# Patient Record
Sex: Male | Born: 1970 | Race: White | Hispanic: No | Marital: Married | Smoking: Former smoker
Health system: Southern US, Community
[De-identification: ages and names within clinical notes are randomized; demographics above are authoritative.]

## PROBLEM LIST (undated history)

## (undated) DIAGNOSIS — Z789 Other specified health status: Secondary | ICD-10-CM

## (undated) HISTORY — PX: NO PAST SURGERIES: SHX2092

---

## 2004-01-24 ENCOUNTER — Emergency Department: Payer: Self-pay | Admitting: Emergency Medicine

## 2004-02-21 ENCOUNTER — Emergency Department: Payer: Self-pay | Admitting: Internal Medicine

## 2004-04-20 ENCOUNTER — Emergency Department: Payer: Self-pay | Admitting: Emergency Medicine

## 2004-05-13 ENCOUNTER — Emergency Department: Payer: Self-pay | Admitting: Emergency Medicine

## 2004-07-18 ENCOUNTER — Emergency Department: Payer: Self-pay | Admitting: Emergency Medicine

## 2004-08-07 ENCOUNTER — Emergency Department: Payer: Self-pay | Admitting: Emergency Medicine

## 2004-09-12 ENCOUNTER — Emergency Department: Payer: Self-pay | Admitting: Emergency Medicine

## 2004-12-02 ENCOUNTER — Emergency Department: Payer: Self-pay | Admitting: Emergency Medicine

## 2005-01-08 ENCOUNTER — Emergency Department: Payer: Self-pay | Admitting: Emergency Medicine

## 2005-04-27 ENCOUNTER — Emergency Department: Payer: Self-pay | Admitting: Emergency Medicine

## 2005-05-22 ENCOUNTER — Emergency Department: Payer: Self-pay | Admitting: Emergency Medicine

## 2005-05-24 ENCOUNTER — Inpatient Hospital Stay: Payer: Self-pay | Admitting: Otolaryngology

## 2005-07-31 ENCOUNTER — Emergency Department: Payer: Self-pay | Admitting: Emergency Medicine

## 2005-10-18 ENCOUNTER — Emergency Department: Payer: Self-pay | Admitting: Emergency Medicine

## 2006-04-10 ENCOUNTER — Emergency Department: Payer: Self-pay | Admitting: Emergency Medicine

## 2006-05-12 ENCOUNTER — Emergency Department: Payer: Self-pay | Admitting: Emergency Medicine

## 2006-09-10 ENCOUNTER — Emergency Department: Payer: Self-pay | Admitting: Emergency Medicine

## 2008-02-23 ENCOUNTER — Emergency Department: Payer: Self-pay | Admitting: Emergency Medicine

## 2008-02-26 ENCOUNTER — Emergency Department: Payer: Self-pay | Admitting: Emergency Medicine

## 2008-09-07 ENCOUNTER — Emergency Department: Payer: Self-pay | Admitting: Emergency Medicine

## 2009-05-04 ENCOUNTER — Emergency Department: Payer: Self-pay | Admitting: Emergency Medicine

## 2009-11-21 ENCOUNTER — Emergency Department: Payer: Self-pay | Admitting: Emergency Medicine

## 2010-11-05 ENCOUNTER — Emergency Department: Payer: Self-pay | Admitting: Emergency Medicine

## 2011-04-30 ENCOUNTER — Emergency Department: Payer: Self-pay | Admitting: Emergency Medicine

## 2011-05-02 LAB — BETA STREP CULTURE(ARMC)

## 2011-05-06 ENCOUNTER — Emergency Department: Payer: Self-pay | Admitting: Emergency Medicine

## 2011-05-07 ENCOUNTER — Emergency Department: Payer: Self-pay | Admitting: Emergency Medicine

## 2011-10-01 ENCOUNTER — Emergency Department: Payer: Self-pay | Admitting: Emergency Medicine

## 2012-02-29 ENCOUNTER — Emergency Department: Payer: Self-pay | Admitting: Emergency Medicine

## 2012-03-25 ENCOUNTER — Emergency Department: Payer: Self-pay | Admitting: Emergency Medicine

## 2012-03-25 LAB — RAPID INFLUENZA A&B ANTIGENS

## 2012-07-24 ENCOUNTER — Emergency Department: Payer: Self-pay | Admitting: Emergency Medicine

## 2012-12-19 ENCOUNTER — Emergency Department: Payer: Self-pay | Admitting: Internal Medicine

## 2012-12-19 LAB — COMPREHENSIVE METABOLIC PANEL
Albumin: 3.8 g/dL (ref 3.4–5.0)
Alkaline Phosphatase: 69 U/L (ref 50–136)
Anion Gap: 5 — ABNORMAL LOW (ref 7–16)
BUN: 14 mg/dL (ref 7–18)
Bilirubin,Total: 0.6 mg/dL (ref 0.2–1.0)
Calcium, Total: 9.1 mg/dL (ref 8.5–10.1)
Chloride: 107 mmol/L (ref 98–107)
Co2: 27 mmol/L (ref 21–32)
Creatinine: 1.21 mg/dL (ref 0.60–1.30)
EGFR (African American): >60
EGFR (Non-African Amer.): >60
Glucose: 81 mg/dL (ref 65–99)
Osmolality: 277 (ref 275–301)
Potassium: 3.7 mmol/L (ref 3.5–5.1)
SGOT(AST): 48 U/L — ABNORMAL HIGH (ref 15–37)
SGPT (ALT): 55 U/L (ref 12–78)
Sodium: 139 mmol/L (ref 136–145)
Total Protein: 7.4 g/dL (ref 6.4–8.2)

## 2012-12-19 LAB — URINALYSIS, COMPLETE
Bacteria: NONE SEEN
Bilirubin,UR: NEGATIVE
Glucose,UR: NEGATIVE mg/dL (ref 0–75)
Ketone: NEGATIVE
Leukocyte Esterase: NEGATIVE
Nitrite: NEGATIVE
Ph: 5 (ref 4.5–8.0)
Protein: NEGATIVE
RBC,UR: 1 /HPF (ref 0–5)
Specific Gravity: 1.026 (ref 1.003–1.030)
Squamous Epithelial: NONE SEEN
WBC UR: <1 /HPF (ref 0–5)

## 2012-12-19 LAB — CBC
HCT: 43.8 % (ref 40.0–52.0)
HGB: 15.1 g/dL (ref 13.0–18.0)
MCH: 29.5 pg (ref 26.0–34.0)
MCHC: 34.5 g/dL (ref 32.0–36.0)
MCV: 85 fL (ref 80–100)
Platelet: 201 10*3/uL (ref 150–440)
RBC: 5.13 10*6/uL (ref 4.40–5.90)
RDW: 13.6 % (ref 11.5–14.5)
WBC: 9.9 10*3/uL (ref 3.8–10.6)

## 2012-12-19 LAB — TROPONIN I: Troponin-I: <0.02 ng/mL

## 2013-03-09 ENCOUNTER — Emergency Department: Payer: Self-pay | Admitting: Emergency Medicine

## 2013-03-09 LAB — RAPID INFLUENZA A&B ANTIGENS

## 2013-06-21 ENCOUNTER — Emergency Department: Payer: Self-pay | Admitting: Emergency Medicine

## 2013-07-25 ENCOUNTER — Emergency Department: Payer: Self-pay | Admitting: Emergency Medicine

## 2014-11-28 ENCOUNTER — Emergency Department
Admission: EM | Admit: 2014-11-28 | Discharge: 2014-11-28 | Disposition: A | Discharge status: Home / Self Care | Payer: Self-pay | Attending: Emergency Medicine | Admitting: Emergency Medicine

## 2014-11-28 DIAGNOSIS — S86912A Strain of unspecified muscle(s) and tendon(s) at lower leg level, left leg, initial encounter: Secondary | ICD-10-CM | POA: Insufficient documentation

## 2014-11-28 DIAGNOSIS — Y998 Other external cause status: Secondary | ICD-10-CM | POA: Insufficient documentation

## 2014-11-28 DIAGNOSIS — Y9389 Activity, other specified: Secondary | ICD-10-CM | POA: Insufficient documentation

## 2014-11-28 DIAGNOSIS — X58XXXA Exposure to other specified factors, initial encounter: Secondary | ICD-10-CM | POA: Insufficient documentation

## 2014-11-28 DIAGNOSIS — Y9289 Other specified places as the place of occurrence of the external cause: Secondary | ICD-10-CM | POA: Insufficient documentation

## 2014-11-28 MED ORDER — TRAMADOL HCL 50 MG PO TABS: 50.0000 mg | ORAL_TABLET | Freq: Two times a day (BID) | ORAL | Status: DC

## 2014-11-28 MED ORDER — NABUMETONE 750 MG PO TABS: 750.0000 mg | ORAL_TABLET | Freq: Two times a day (BID) | ORAL | Status: DC

## 2014-11-28 NOTE — ED Notes (Signed)
Pt states that he injured his knee walking down a hill a week ago, pt states that the pain has cont and 2 nights ago he twisted again, pt c/o severe pain

## 2014-11-28 NOTE — Discharge Instructions (Signed)
Knee Sprain A knee sprain is a tear in one of the strong, fibrous tissues that connect the bones (ligaments) in your knee. The severity of the sprain depends on how much of the ligament is torn. The tear can be either partial or complete. CAUSES  Often, sprains are a result of a fall or injury. The force of the impact causes the fibers of your ligament to stretch too much. This excess tension causes the fibers of your ligament to tear. SIGNS AND SYMPTOMS  You may have some loss of motion in your knee. Other symptoms include:  Bruising.  Pain in the knee area.  Tenderness of the knee to the touch.  Swelling. DIAGNOSIS  To diagnose a knee sprain, your health care provider will physically examine your knee. Your health care provider may also suggest an X-ray exam of your knee to make sure no bones are broken. TREATMENT  If your ligament is only partially torn, treatment usually involves keeping the knee in a fixed position (immobilization) or bracing your knee for activities that require movement for several weeks. To do this, your health care provider will apply a bandage, cast, or splint to keep your knee from moving and to support your knee during movement until it heals. For a partially torn ligament, the healing process usually takes 4-6 weeks. If your ligament is completely torn, depending on which ligament it is, you may need surgery to reconnect the ligament to the bone or reconstruct it. After surgery, a cast or splint may be applied and will need to stay on your knee for 4-6 weeks while your ligament heals. HOME CARE INSTRUCTIONS  Keep your injured knee elevated to decrease swelling.  To ease pain and swelling, apply ice to the injured area:  Put ice in a plastic bag.  Place a towel between your skin and the bag.  Leave the ice on for 20 minutes, 2-3 times a day.  Only take medicine for pain as directed by your health care provider.  Do not leave your knee unprotected until  pain and stiffness go away (usually 4-6 weeks).  If you have a cast or splint, do not allow it to get wet. If you have been instructed not to remove it, cover it with a plastic bag when you shower or bathe. Do not swim.  Your health care provider may suggest exercises for you to do during your recovery to prevent or limit permanent weakness and stiffness. SEEK IMMEDIATE MEDICAL CARE IF:  Your cast or splint becomes damaged.  Your pain becomes worse.  You have significant pain, swelling, or numbness below the cast or splint. MAKE SURE YOU:  Understand these instructions.  Will watch your condition.  Will get help right away if you are not doing well or get worse. Document Released: 02/26/2005 Document Revised: 12/17/2012 Document Reviewed: 10/08/2012 [facility] Patient Information 2015 [place], [place]. This information is not intended to replace advice given to you by your health care provider. Make sure you discuss any questions you have with your health care provider.   Take the prescription meds as directed. Follow-up with Dr. Jimmey Ralph as needed.

## 2014-11-29 NOTE — ED Provider Notes (Signed)
[facility] Emergency Department Provider Note ____________________________________________  Time seen: 2035  I have reviewed the triage vital signs and the nursing notes.  HISTORY  Chief Complaint  Knee Pain  HPI Mike Herrera is a 44 y.o. male reports to the ED for evaluation of left knee pain, that was originally injured it better than a week ago when he [place]. He treated the strain with ice and anti-inflammatories at that time. 2 nights ago he reinjured it again as he twisted the knee while bending it. He is here today for evaluation of his left knee pain.He rates his pain at an 8/10 in triage. He denies prior history of ongoing knee problems.  No past medical history on file.  There are no active problems to display for this patient.  No past surgical history on file.  Current Outpatient Rx  Name  Route  Sig  Dispense  Refill  . nabumetone (RELAFEN) 750 MG tablet   Oral   Take 1 tablet (750 mg total) by mouth 2 (two) times daily.   30 tablet   0   . traMADol (ULTRAM) 50 MG tablet   Oral   Take 1 tablet (50 mg total) by mouth 2 (two) times daily.   10 tablet   0    Allergies Review of patient's allergies indicates no known allergies.  No family history on file.  Social History Social History  Substance Use Topics  . Smoking status: Not on file  . Smokeless tobacco: Not on file  . Alcohol Use: Not on file   Review of Systems  Constitutional: Negative for fever. Eyes: Negative for visual changes. ENT: Negative for sore throat. Cardiovascular: Negative for chest pain. Respiratory: Negative for shortness of breath. Gastrointestinal: Negative for abdominal pain, vomiting and diarrhea. Genitourinary: Negative for dysuria. Musculoskeletal: Negative for back pain. Left knee pain as above Skin: Negative for rash. Neurological: Negative for headaches, focal weakness or  numbness. ____________________________________________  PHYSICAL EXAM:  VITAL SIGNS: ED Triage Vitals  Enc Vitals Group     BP 11/28/14 1916 114/73 mmHg     Pulse Rate 11/28/14 1916 103     Resp 11/28/14 1916 20     Temp 11/28/14 1916 97.7 F (36.5 C)     Temp Source 11/28/14 1916 Oral     SpO2 11/28/14 1916 99 %     Weight 11/28/14 1916 240 lb (108.863 kg)     Height 11/28/14 1916  (1.93 m)     Head Cir --      Peak Flow --      Pain Score 11/28/14 1917 8     Pain Loc --      Pain Edu? --      Excl. in GC? --    Constitutional: Alert and oriented. Well appearing and in no distress. Eyes: Conjunctivae are normal. PERRL. Normal extraocular movements. ENT   Head: Normocephalic and atraumatic.   Nose: No congestion/rhinorrhea.   Mouth/Throat: Mucous membranes are moist.   Neck: Supple. No thyromegaly. Hematological/Lymphatic/Immunological: No cervical lymphadenopathy. Cardiovascular: Normal rate, regular rhythm.  Respiratory: Normal respiratory effort. No wheezes/rales/rhonchi. Gastrointestinal: Soft and nontender. No distention. Musculoskeletal: Left knee without obvious deformity, dislocation, or abrasion. Normal patellar tracking without laxity. Small joint effusion noted. Minor intermittent clicking and lateral patella with full flexion. No valgus or varus joint stress is appreciated. Negative anterior posterior drawer. No popliteal space fullness is noted. Negative Lachman and McMurray's on exam.  Nontender with normal range of motion in all extremities.  Neurologic:  Normal gait without ataxia. Normal speech and language. No gross focal neurologic deficits are appreciated. Skin:  Skin is warm, dry and intact. No rash noted. Psychiatric: Mood and affect are normal. Patient exhibits appropriate insight and judgment. ____________________________________________   RADIOLOGY deferred ____________________________________________  PROCEDURES  Knee  immobilizer applied ____________________________________________  INITIAL IMPRESSION / ASSESSMENT AND PLAN / ED COURSE  Left knee strain without indication for derangement. Suggest treatment with knee immobilizer, anti-inflammatories, and pain medicines as needed. Patient is encouraged to ice and rest the knee, and follow-up with orthopedics for symptoms persisting beyond a week. ____________________________________________  FINAL CLINICAL IMPRESSION(S) / ED DIAGNOSES  Final diagnoses:  Knee strain, left, initial encounter     Lissa Hoard, PA-C 11/29/14 0008  Emily Filbert, MD 12/01/14 [phone]

## 2015-02-14 ENCOUNTER — Emergency Department
Admission: EM | Admit: 2015-02-14 | Discharge: 2015-02-14 | Disposition: A | Discharge status: Home / Self Care | Payer: Self-pay | Attending: Emergency Medicine | Admitting: Emergency Medicine

## 2015-02-14 ENCOUNTER — Emergency Department: Payer: Self-pay

## 2015-02-14 ENCOUNTER — Encounter: Payer: Self-pay | Admitting: Emergency Medicine

## 2015-02-14 DIAGNOSIS — Z87891 Personal history of nicotine dependence: Secondary | ICD-10-CM | POA: Insufficient documentation

## 2015-02-14 DIAGNOSIS — M2392 Unspecified internal derangement of left knee: Secondary | ICD-10-CM | POA: Insufficient documentation

## 2015-02-14 DIAGNOSIS — Z791 Long term (current) use of non-steroidal anti-inflammatories (NSAID): Secondary | ICD-10-CM | POA: Insufficient documentation

## 2015-02-14 MED ORDER — NAPROXEN 500 MG PO TABS
500.0000 mg | ORAL_TABLET | Freq: Once | ORAL | Status: AC
  Administered 2015-02-14: 500 mg via ORAL
  Filled 2015-02-14: qty 1

## 2015-02-14 MED ORDER — TRAMADOL HCL 50 MG PO TABS
50.0000 mg | ORAL_TABLET | Freq: Once | ORAL | Status: AC
  Administered 2015-02-14: 50 mg via ORAL
  Filled 2015-02-14: qty 1

## 2015-02-14 MED ORDER — NAPROXEN SODIUM 275 MG PO TABS: 275.0000 mg | ORAL_TABLET | Freq: Two times a day (BID) | ORAL | Status: DC

## 2015-02-14 MED ORDER — TRAMADOL HCL 50 MG PO TABS: 50.0000 mg | ORAL_TABLET | Freq: Four times a day (QID) | ORAL | Status: AC | PRN

## 2015-02-14 NOTE — ED Notes (Signed)
Pt in w/ complaints of left knee pain, states hearing popping noise when walking.  Pt reports incident about a month ago injuring knee; was seen in ED and given brace to wear but states knee has not improved.

## 2015-02-14 NOTE — ED Provider Notes (Signed)
[facility] Emergency Department Provider Note  ____________________________________________  Time seen: Approximately 5:20 PM  I have reviewed the triage vital signs and the nursing notes.   HISTORY  Chief Complaint Knee Pain    HPI Mike Herrera is a 44 y.o. male patient complaining of continued left knee pain. Patient stated a month ago while walking down an embankment he felt a popping sensation and since then he is having continual pain. Patient stated he was seen in this ER 1 month ago and was given a knee brace but has not helped his knee. When questioned patient admits that he was given discharge instructions to follow orthopedics lost the paperwork. No other palliative measures taken for this complaint. Patient rated the pain as a 5/10. Patient state no x-rays taken on his previous visit.  History reviewed. No pertinent past medical history.  There are no active problems to display for this patient.   History reviewed. No pertinent past surgical history.  Current Outpatient Rx  Name  Route  Sig  Dispense  Refill  . nabumetone (RELAFEN) 750 MG tablet   Oral   Take 1 tablet (750 mg total) by mouth 2 (two) times daily.   30 tablet   0   . traMADol (ULTRAM) 50 MG tablet   Oral   Take 1 tablet (50 mg total) by mouth 2 (two) times daily.   10 tablet   0     Allergies Review of patient's allergies indicates no known allergies.  No family history on file.  Social History Social History  Substance Use Topics  . Smoking status: Former Games developer  . Smokeless tobacco: None  . Alcohol Use: No    Review of Systems Constitutional: No fever/chills Eyes: No visual changes. ENT: No sore throat. Cardiovascular: Denies chest pain. Respiratory: Denies shortness of breath. Gastrointestinal: No abdominal pain.  No nausea, no vomiting.  No diarrhea.  No constipation. Genitourinary: Negative for dysuria. Musculoskeletal: Negative for back pain. Skin:  Negative for rash. Neurological: Negative for headaches, focal weakness or numbness. 10-point ROS otherwise negative.  ____________________________________________   PHYSICAL EXAM:  VITAL SIGNS: ED Triage Vitals  Enc Vitals Group     BP 02/14/15 1657 132/82 mmHg     Pulse Rate 02/14/15 1657 71     Resp 02/14/15 1657 18     Temp 02/14/15 1657 98.2 F (36.8 C)     Temp Source 02/14/15 1657 Oral     SpO2 02/14/15 1657 98 %     Weight 02/14/15 1657 240 lb (108.863 kg)     Height 02/14/15 1657  (1.88 m)     Head Cir --      Peak Flow --      Pain Score 02/14/15 1658 5     Pain Loc --      Pain Edu? --      Excl. in GC? --     Constitutional: Alert and oriented. Well appearing and in no acute distress. Eyes: Conjunctivae are normal. PERRL. EOMI. Head: Atraumatic. Nose: No congestion/rhinnorhea. Mouth/Throat: Mucous membranes are moist.  Oropharynx non-erythematous. Neck: No stridor.  No cervical spine tenderness to palpation. Hematological/Lymphatic/Immunilogical: No cervical lymphadenopathy. Cardiovascular: Normal rate, regular rhythm. Grossly normal heart sounds.  Good peripheral circulation. Respiratory: Normal respiratory effort.  No retractions. Lungs CTAB. Gastrointestinal: Soft and nontender. No distention. No abdominal bruits. No CVA tenderness. Musculoskeletal: No DEFORMITY of the left knee. Moderate guarding palpation of femur and tibial joint there is some marked  crepitus with palpation anterior patella.  Neurologic:  Normal speech and language. No gross focal neurologic deficits are appreciated. No gait instability. Skin:  Skin is warm, dry and intact. No rash noted. Psychiatric: Mood and affect are normal. Speech and behavior are normal.  ____________________________________________   LABS (all labs ordered are listed, but only abnormal results are displayed)  Labs Reviewed - No data to  display ____________________________________________  EKG   ____________________________________________  RADIOLOGY  No acute findings on x-ray. ____________________________________________   PROCEDURES  Procedure(s) performed: None  Critical Care performed: No  ____________________________________________   INITIAL IMPRESSION / ASSESSMENT AND PLAN / ED COURSE  Pertinent labs & imaging results that were available during my care of the patient were reviewed by me and considered in my medical decision making (see chart for details).  Left knee pain. Discussed x-ray findings with palpation. Advised patient follow orthopedics for definitive evaluation and treatment. Patient advised to continue wearing his knee immobilizer as needed. Patient prescription for naproxen and tramadol. ____________________________________________   FINAL CLINICAL IMPRESSION(S) / ED DIAGNOSES  Final diagnoses:  Internal derangement of left knee      Joni ReiningRonald K Smith, PA-C 02/14/15 1822  Sharman CheekPhillip Stafford, MD 02/15/15 0008

## 2015-02-14 NOTE — ED Notes (Signed)
Knee pain x 2 months, has not seen orthopedist, states pain is worse

## 2015-02-14 NOTE — Discharge Instructions (Signed)
Weart knee support and follow orthopedics as directed.

## 2015-03-14 ENCOUNTER — Encounter: Payer: Self-pay | Admitting: Emergency Medicine

## 2015-03-14 ENCOUNTER — Emergency Department
Admission: EM | Admit: 2015-03-14 | Discharge: 2015-03-14 | Disposition: A | Discharge status: Home / Self Care | Payer: Self-pay | Attending: Emergency Medicine | Admitting: Emergency Medicine

## 2015-03-14 DIAGNOSIS — Z79899 Other long term (current) drug therapy: Secondary | ICD-10-CM | POA: Insufficient documentation

## 2015-03-14 DIAGNOSIS — J029 Acute pharyngitis, unspecified: Secondary | ICD-10-CM

## 2015-03-14 DIAGNOSIS — Z87891 Personal history of nicotine dependence: Secondary | ICD-10-CM | POA: Insufficient documentation

## 2015-03-14 DIAGNOSIS — Z791 Long term (current) use of non-steroidal anti-inflammatories (NSAID): Secondary | ICD-10-CM | POA: Insufficient documentation

## 2015-03-14 DIAGNOSIS — H9201 Otalgia, right ear: Secondary | ICD-10-CM

## 2015-03-14 LAB — POCT RAPID STREP A: Streptococcus, Group A Screen (Direct): NEGATIVE

## 2015-03-14 MED ORDER — DEXAMETHASONE 1 MG/ML PO CONC
10.0000 mg | Freq: Once | ORAL | Status: AC
  Administered 2015-03-14: 10 mg via ORAL
  Filled 2015-03-14: qty 10

## 2015-03-14 MED ORDER — PSEUDOEPHEDRINE HCL 30 MG PO TABS: 30.0000 mg | ORAL_TABLET | Freq: Four times a day (QID) | ORAL | Status: DC | PRN

## 2015-03-14 MED ORDER — LIDOCAINE VISCOUS 2 % MT SOLN
15.0000 mL | Freq: Once | OROMUCOSAL | Status: AC
  Administered 2015-03-14: 15 mL via OROMUCOSAL
  Filled 2015-03-14: qty 15

## 2015-03-14 NOTE — Discharge Instructions (Signed)
Earache  An earache, also called otalgia, can be caused by many things. Pain from an earache can be sharp, dull, or burning. The pain may be temporary or constant.  Earaches can be caused by problems with the ear, such as infection in either the middle ear or the ear canal, injury, impacted ear wax, middle ear pressure, or a foreign body in the ear. Ear pain can also result from problems in other areas. This is called referred pain. For example, pain can come from a sore throat, a tooth infection, or problems with the jaw or the joint between the jaw and the skull (temporomandibular joint, or TMJ).  The cause of an earache is not always easy to identify. Watchful waiting may be appropriate for some earaches until a clear cause of the pain can be found.  HOME CARE INSTRUCTIONS  Watch your condition for any changes. The following actions may help to lessen any discomfort that you are feeling:  · Take medicines only as directed by your health care provider. This includes ear drops.  · Apply ice to your outer ear to help reduce pain.    Put ice in a plastic bag.    Place a towel between your skin and the bag.    Leave the ice on for 20 minutes, 2-3 times per day.  · Do not put anything in your ear other than medicine that is prescribed by your health care provider.  · Try resting in an upright position instead of lying down. This may help to reduce pressure in the middle ear and relieve pain.  · Chew gum if it helps to relieve your ear pain.  · Control any allergies that you have.  · Keep all follow-up visits as directed by your health care provider. This is important.  SEEK MEDICAL CARE IF:  · Your pain does not improve within 2 days.  · You have a fever.  · You have new or worsening symptoms.  SEEK IMMEDIATE MEDICAL CARE IF:  · You have a severe headache.  · You have a stiff neck.  · You have difficulty swallowing.  · You have redness or swelling behind your ear.  · You have drainage from your ear.  · You have hearing  loss.  · You feel dizzy.     This information is not intended to replace advice given to you by your health care provider. Make sure you discuss any questions you have with your health care provider.     Document Released: 10/14/2003 Document Revised: 03/19/2014 Document Reviewed: 09/27/2013  Elsevier Interactive Patient Education ©2016 [employer]    Pharyngitis  Pharyngitis is redness, pain, and swelling (inflammation) of your pharynx.   CAUSES   Pharyngitis is usually caused by infection. Most of the time, these infections are from viruses (viral) and are part of a cold. However, sometimes pharyngitis is caused by bacteria (bacterial). Pharyngitis can also be caused by allergies. Viral pharyngitis may be spread from person to person by coughing, sneezing, and personal items or utensils (cups, forks, spoons, toothbrushes). Bacterial pharyngitis may be spread from person to person by more intimate contact, such as kissing.   SIGNS AND SYMPTOMS   Symptoms of pharyngitis include:    · Sore throat.    · Tiredness (fatigue).    · Low-grade fever.    · Headache.  · Joint pain and muscle aches.  · Skin rashes.  · Swollen lymph nodes.  · Plaque-like film on throat or tonsils (often seen with bacterial   pharyngitis).  DIAGNOSIS   Your health care provider will ask you questions about your illness and your symptoms. Your medical history, along with a physical exam, is often all that is needed to diagnose pharyngitis. Sometimes, a rapid strep test is done. Other lab tests may also be done, depending on the suspected cause.   TREATMENT   Viral pharyngitis will usually get better in 3-4 days without the use of medicine. Bacterial pharyngitis is treated with medicines that kill germs (antibiotics).   HOME CARE INSTRUCTIONS   · Drink enough water and fluids to keep your urine clear or pale yellow.    · Only take over-the-counter or prescription medicines as directed by your health care provider:      If you are prescribed  antibiotics, make sure you finish them even if you start to feel better.      Do not take aspirin.    · Get lots of rest.    · Gargle with 8 oz of salt water (½ tsp of salt per 1 qt of water) as often as every 1-2 hours to soothe your throat.    · Throat lozenges (if you are not at risk for choking) or sprays may be used to soothe your throat.  SEEK MEDICAL CARE IF:   · You have large, tender lumps in your neck.  · You have a rash.  · You cough up green, yellow-brown, or bloody spit.  SEEK IMMEDIATE MEDICAL CARE IF:   · Your neck becomes stiff.  · You drool or are unable to swallow liquids.  · You vomit or are unable to keep medicines or liquids down.  · You have severe pain that does not go away with the use of recommended medicines.  · You have trouble breathing (not caused by a stuffy nose).  MAKE SURE YOU:   · Understand these instructions.  · Will watch your condition.  · Will get help right away if you are not doing well or get worse.     This information is not intended to replace advice given to you by your health care provider. Make sure you discuss any questions you have with your health care provider.     Document Released: 02/26/2005 Document Revised: 12/17/2012 Document Reviewed: 11/03/2012  Elsevier Interactive Patient Education ©2016 [employer]

## 2015-03-14 NOTE — ED Notes (Signed)
Pt states that he had a fever and a cold that has ran its course, pt states now he is having rt sided throat and ear pain, states that it is a burning sensation when he swallows

## 2015-03-14 NOTE — ED Provider Notes (Signed)
[facility] Emergency Department Provider Note  ____________________________________________  Time seen: Approximately 355 AM  I have reviewed the triage vital signs and the nursing notes.   HISTORY  Chief Complaint Sore Throat    HPI Mike Herrera is a 45 y.o. male who comes in today with pain to his right ear and throat. The patient reports that the symptoms started 3 days ago. He reports his throat burns whenever he eats or drinks. The patient has been taking ibuprofen and has not been helping. The patient reports that this past week he had a sinus cold. He reports that those symptoms were improving and then the started. The patient reports that when he swallows the pain radiates up to his right ear. The patient denies any sick contacts but has a pain it's a 5-6 out of 10 in intensity. He reports that the pain is worse when he eats or drinks. The patient has not had any fevers no other cough or difficulty swallowing. The patient has also noticed a difference and a change to his voice.   History reviewed. No pertinent past medical history.  There are no active problems to display for this patient.   History reviewed. No pertinent past surgical history.  Current Outpatient Rx  Name  Route  Sig  Dispense  Refill  . nabumetone (RELAFEN) 750 MG tablet   Oral   Take 1 tablet (750 mg total) by mouth 2 (two) times daily.   30 tablet   0   . naproxen sodium (ANAPROX) 275 MG tablet   Oral   Take 1 tablet (275 mg total) by mouth 2 (two) times daily with a meal.   30 tablet   2   . pseudoephedrine (SUDAFED) 30 MG tablet   Oral   Take 1 tablet (30 mg total) by mouth every 6 (six) hours as needed for congestion.   12 tablet   0   . traMADol (ULTRAM) 50 MG tablet   Oral   Take 1 tablet (50 mg total) by mouth 2 (two) times daily.   10 tablet   0   . traMADol (ULTRAM) 50 MG tablet   Oral   Take 1 tablet (50 mg total) by mouth every 6 (six) hours as  needed.   20 tablet   0     Allergies Review of patient's allergies indicates no known allergies.  No family history on file.  Social History Social History  Substance Use Topics  . Smoking status: Former Games developer  . Smokeless tobacco: None  . Alcohol Use: No    Review of Systems Constitutional: No fever/chills Eyes: No visual changes. ENT:  sore throat and right-sided ear pain Cardiovascular: Denies chest pain. Respiratory: Denies shortness of breath. Gastrointestinal: No abdominal pain.  No nausea, no vomiting.  No diarrhea.  No constipation. Genitourinary: Negative for dysuria. Musculoskeletal: Negative for back pain. Skin: Negative for rash. Neurological: Negative for headaches, focal weakness or numbness. 10-point ROS otherwise negative.  ____________________________________________   PHYSICAL EXAM:  VITAL SIGNS: ED Triage Vitals  Enc Vitals Group     BP 03/14/15 0049 117/69 mmHg     Pulse Rate 03/14/15 0049 60     Resp 03/14/15 0049 19     Temp 03/14/15 0049 97.7 F (36.5 C)     Temp Source 03/14/15 0049 Oral     SpO2 03/14/15 0049 95 %     Weight 03/14/15 0049 240 lb (108.863 kg)     Height 03/14/15  0049 6\' 4"  (1.93 m)     Head Cir --      Peak Flow --      Pain Score 03/14/15 0051 6     Pain Loc --      Pain Edu? --      Excl. in GC? --     Constitutional: Alert and oriented. Well appearing and in mild distress. Eyes: Conjunctivae are normal. PERRL. EOMI. Ears: Fluid behind bilateral TMs Head: Atraumatic. Nose: No congestion/rhinnorhea. Mouth/Throat: Mucous membranes are moist.  Oropharynx non-erythematous. Some soft tissue growth without swelling behind right tonsil no visible peri tonsillar abscess Neck: No stridor.  Hematological/Lymphatic/Immunilogical: No cervical lymphadenopathy. Cardiovascular: Normal rate, regular rhythm. Grossly normal heart sounds.  Good peripheral circulation. Respiratory: Normal respiratory effort.  No retractions.  Lungs CTAB. Gastrointestinal: Soft and nontender. No distention. Positive bowel sounds Musculoskeletal: No lower extremity tenderness nor edema.   Neurologic:  Normal speech and language. Skin:  Skin is warm, dry and intact. Psychiatric: Mood and affect are normal.   ____________________________________________   LABS (all labs ordered are listed, but only abnormal results are displayed)  Labs Reviewed  POCT RAPID STREP A   ____________________________________________  EKG  None ____________________________________________  RADIOLOGY  None ____________________________________________   PROCEDURES  Procedure(s) performed: None  Critical Care performed: No  ____________________________________________   INITIAL IMPRESSION / ASSESSMENT AND PLAN / ED COURSE  Pertinent labs & imaging results that were available during my care of the patient were reviewed by me and considered in my medical decision making (see chart for details).  This is a [age] year old male who comes into the hospital today with some right-sided throat pain and ear pain. I'll give the patient some Decadron as well as viscous lidocaine. I will reassess the patient once I received those results. The patient did receive a strep test that was negative. He will have a culture sent as well.  The patient reports this pain is improved at this time. The area on the right side of the patient's throat does not show peritonsillar abscess but what looks like a fleshy growth. I will discharge the patient to home and [facility]. I will also give the patient some decongestants to help with the fluid behind his ears. The patient's voice is also improved.  ____________________________________________   FINAL CLINICAL IMPRESSION(S) / ED DIAGNOSES  Final diagnoses:  Pharyngitis  Otalgia, right      Rebecka ApleyAllison P Jerrod Damiano, MD 03/14/15 [phone]

## 2015-05-14 ENCOUNTER — Emergency Department
Admission: EM | Admit: 2015-05-14 | Discharge: 2015-05-14 | Disposition: A | Discharge status: Home / Self Care | Payer: Self-pay | Attending: Emergency Medicine | Admitting: Emergency Medicine

## 2015-05-14 ENCOUNTER — Emergency Department: Payer: Self-pay

## 2015-05-14 ENCOUNTER — Encounter: Payer: Self-pay | Admitting: Emergency Medicine

## 2015-05-14 DIAGNOSIS — Z791 Long term (current) use of non-steroidal anti-inflammatories (NSAID): Secondary | ICD-10-CM | POA: Insufficient documentation

## 2015-05-14 DIAGNOSIS — Z87891 Personal history of nicotine dependence: Secondary | ICD-10-CM | POA: Insufficient documentation

## 2015-05-14 DIAGNOSIS — Z79899 Other long term (current) drug therapy: Secondary | ICD-10-CM | POA: Insufficient documentation

## 2015-05-14 DIAGNOSIS — J069 Acute upper respiratory infection, unspecified: Secondary | ICD-10-CM | POA: Insufficient documentation

## 2015-05-14 MED ORDER — IPRATROPIUM-ALBUTEROL 0.5-2.5 (3) MG/3ML IN SOLN
3.0000 mL | Freq: Once | RESPIRATORY_TRACT | Status: AC
  Administered 2015-05-14: 3 mL via RESPIRATORY_TRACT

## 2015-05-14 MED ORDER — IPRATROPIUM-ALBUTEROL 0.5-2.5 (3) MG/3ML IN SOLN
RESPIRATORY_TRACT | Status: AC
  Filled 2015-05-14: qty 3

## 2015-05-14 MED ORDER — AZITHROMYCIN 250 MG PO TABS: ORAL_TABLET | ORAL | Status: AC

## 2015-05-14 MED ORDER — BENZONATATE 100 MG PO CAPS: 100.0000 mg | ORAL_CAPSULE | Freq: Four times a day (QID) | ORAL | Status: AC | PRN

## 2015-05-14 NOTE — Discharge Instructions (Signed)
Upper Respiratory Infection, Adult Most upper respiratory infections (URIs) are a viral infection of the air passages leading to the lungs. A URI affects the nose, throat, and upper air passages. The most common type of URI is nasopharyngitis and is typically referred to as "the common cold." URIs run their course and usually go away on their own. Most of the time, a URI does not require medical attention, but sometimes a bacterial infection in the upper airways can follow a viral infection. This is called a secondary infection. Sinus and middle ear infections are common types of secondary upper respiratory infections. Bacterial pneumonia can also complicate a URI. A URI can worsen asthma and chronic obstructive pulmonary disease (COPD). Sometimes, these complications can require emergency medical care and may be life threatening.  CAUSES Almost all URIs are caused by viruses. A virus is a type of germ and can spread from one person to another.  RISKS FACTORS You may be at risk for a URI if:   You smoke.   You have chronic heart or lung disease.  You have a weakened defense (immune) system.   You are very young or very old.   You have nasal allergies or asthma.  You work in crowded or poorly ventilated areas.  You work in health care facilities or schools. SIGNS AND SYMPTOMS  Symptoms typically develop 2-3 days after you come in contact with a cold virus. Most viral URIs last 7-10 days. However, viral URIs from the influenza virus (flu virus) can last 14-18 days and are typically more severe. Symptoms may include:   Runny or stuffy (congested) nose.   Sneezing.   Cough.   Sore throat.   Headache.   Fatigue.   Fever.   Loss of appetite.   Pain in your forehead, behind your eyes, and over your cheekbones (sinus pain).  Muscle aches.  DIAGNOSIS  Your health care provider may diagnose a URI by:  Physical exam.  Tests to check that your symptoms are not due to  another condition such as:  Strep throat.  Sinusitis.  Pneumonia.  Asthma. TREATMENT  A URI goes away on its own with time. It cannot be cured with medicines, but medicines may be prescribed or recommended to relieve symptoms. Medicines may help:  Reduce your fever.  Reduce your cough.  Relieve nasal congestion. HOME CARE INSTRUCTIONS   Take medicines only as directed by your health care provider.   Gargle warm saltwater or take cough drops to comfort your throat as directed by your health care provider.  Use a warm mist humidifier or inhale steam from a shower to increase air moisture. This may make it easier to breathe.  Drink enough fluid to keep your urine clear or pale yellow.   Eat soups and other clear broths and maintain good nutrition.   Rest as needed.   Return to work when your temperature has returned to normal or as your health care provider advises. You may need to stay home longer to avoid infecting others. You can also use a face mask and careful hand washing to prevent spread of the virus.  Increase the usage of your inhaler if you have asthma.   Do not use any tobacco products, including cigarettes, chewing tobacco, or electronic cigarettes. If you need help quitting, ask your health care provider. PREVENTION  The best way to protect yourself from getting a cold is to practice good hygiene.   Avoid oral or hand contact with people with cold   symptoms.   Wash your hands often if contact occurs.  There is no clear evidence that vitamin C, vitamin E, echinacea, or exercise reduces the chance of developing a cold. However, it is always recommended to get plenty of rest, exercise, and practice good nutrition.  SEEK MEDICAL CARE IF:   You are getting worse rather than better.   Your symptoms are not controlled by medicine.   You have chills.  You have worsening shortness of breath.  You have brown or red mucus.  You have yellow or brown nasal  discharge.  You have pain in your face, especially when you bend forward.  You have a fever.  You have swollen neck glands.  You have pain while swallowing.  You have white areas in the back of your throat. SEEK IMMEDIATE MEDICAL CARE IF:   You have severe or persistent:  Headache.  Ear pain.  Sinus pain.  Chest pain.  You have chronic lung disease and any of the following:  Wheezing.  Prolonged cough.  Coughing up blood.  A change in your usual mucus.  You have a stiff neck.  You have changes in your:  Vision.  Hearing.  Thinking.  Mood. MAKE SURE YOU:   Understand these instructions.  Will watch your condition.  Will get help right away if you are not doing well or get worse.   This information is not intended to replace advice given to you by your health care provider. Make sure you discuss any questions you have with your health care provider.   Document Released: 08/22/2000 Document Revised: 07/13/2014 Document Reviewed: 06/03/2013 Elsevier Interactive Patient Education 2016 [employer]  

## 2015-05-14 NOTE — ED Notes (Signed)
Pt states sneezing and coughing for 3 days. Pt states congested cough, no sputum production. Pt with frequent cough noted during assessment. Pt denies known fever. Pt states has bilateral lateral rib pain from frequent coughing. Pt states "i've shit on myself i've coughed so hard." skin pwd.

## 2015-05-14 NOTE — ED Notes (Signed)
Cough and sneezing x 3 days. Denies fevers but does have body aches.

## 2015-05-14 NOTE — ED Notes (Signed)
No wheezing auscultated after breathing treatment

## 2015-05-14 NOTE — ED Provider Notes (Signed)
[facility] Emergency Department Provider Note  ____________________________________________  Time seen: On arrival  I have reviewed the triage vital signs and the nursing notes.   HISTORY  Chief Complaint Cough    HPI Mike Herrera is a 45 y.o. male who presents with cough, sneezing and congestion for 3 days. His children have also been suffering from similar complaints over the last week. He denies shortness of breath. Cough is nonproductive. He does not smoke. No recent travel. No chest pain. He has been taking DayQuil and NyQuil    History reviewed. No pertinent past medical history.  There are no active problems to display for this patient.   History reviewed. No pertinent past surgical history.  Current Outpatient Rx  Name  Route  Sig  Dispense  Refill  . azithromycin (ZITHROMAX Z-PAK) 250 MG tablet      Take 2 tablets (500 mg) on  Day 1,  followed by 1 tablet (250 mg) once daily on Days 2 through 5.   6 each   0   . benzonatate (TESSALON PERLES) 100 MG capsule   Oral   Take 1 capsule (100 mg total) by mouth every 6 (six) hours as needed for cough.   30 capsule   0   . nabumetone (RELAFEN) 750 MG tablet   Oral   Take 1 tablet (750 mg total) by mouth 2 (two) times daily.   30 tablet   0   . naproxen sodium (ANAPROX) 275 MG tablet   Oral   Take 1 tablet (275 mg total) by mouth 2 (two) times daily with a meal.   30 tablet   2   . pseudoephedrine (SUDAFED) 30 MG tablet   Oral   Take 1 tablet (30 mg total) by mouth every 6 (six) hours as needed for congestion.   12 tablet   0   . traMADol (ULTRAM) 50 MG tablet   Oral   Take 1 tablet (50 mg total) by mouth 2 (two) times daily.   10 tablet   0   . traMADol (ULTRAM) 50 MG tablet   Oral   Take 1 tablet (50 mg total) by mouth every 6 (six) hours as needed.   20 tablet   0     Allergies Review of patient's allergies indicates no known allergies.  No family history on  file.  Social History Social History  Substance Use Topics  . Smoking status: Former Games developer  . Smokeless tobacco: None  . Alcohol Use: No    Review of Systems  Constitutional: Negative for fever. Eyes: Negative for discharge ENT: Positive for sore throat    Musculoskeletal: Positive for myalgias Skin: Negative for rash. Neurological: Negative for focal weakness   ____________________________________________   PHYSICAL EXAM:  VITAL SIGNS: ED Triage Vitals  Enc Vitals Group     BP 05/14/15 1844 120/85 mmHg     Pulse Rate 05/14/15 1844 86     Resp 05/14/15 1844 18     Temp 05/14/15 1844 98.9 F (37.2 C)     Temp Source 05/14/15 1844 Oral     SpO2 05/14/15 1844 98 %     Weight 05/14/15 1844 240 lb (108.863 kg)     Height 05/14/15 1844 6' (1.829 m)     Head Cir --      Peak Flow --      Pain Score 05/14/15 1846 8     Pain Loc --      Pain  Edu? --      Excl. in GC? --      Constitutional: Alert and oriented. Well appearing and in no distress. Eyes: Conjunctivae are normal.  ENT   Head: Normocephalic and atraumatic.   Mouth/Throat: Mucous membranes are moist. Cardiovascular: Normal rate, regular rhythm.  Respiratory: Normal respiratory effort without tachypnea nor retractions. Clear to auscultation bilaterally  Musculoskeletal: Nontender with normal range of motion in all extremities. Neurologic:  Normal speech and language. No gross focal neurologic deficits are appreciated. Skin:  Skin is warm, dry and intact. No rash noted. Psychiatric: Mood and affect are normal. Patient exhibits appropriate insight and judgment.  ____________________________________________    LABS (pertinent positives/negatives)  Labs Reviewed - No data to display  ____________________________________________     ____________________________________________    RADIOLOGY I have personally reviewed any xrays that were ordered on this patient: Chest x-ray  unremarkable  ____________________________________________   PROCEDURES  Procedure(s) performed: none   ____________________________________________   INITIAL IMPRESSION / ASSESSMENT AND PLAN / ED COURSE  Pertinent labs & imaging results that were available during my care of the patient were reviewed by me and considered in my medical decision making (see chart for details).  History of present illness most consistent with upper respiratory infection, likely viral. Recommend supportive care and outpatient follow up as needed and return precautions discussed  ____________________________________________   FINAL CLINICAL IMPRESSION(S) / ED DIAGNOSES  Final diagnoses:  Upper respiratory infection     Jene Everyobert Maryclaire Stoecker, MD 05/14/15 2311

## 2015-12-24 ENCOUNTER — Emergency Department
Admission: EM | Admit: 2015-12-24 | Discharge: 2015-12-24 | Disposition: A | Discharge status: Home / Self Care | Payer: Medicaid Other | Attending: Student | Admitting: Student

## 2015-12-24 ENCOUNTER — Emergency Department: Payer: Medicaid Other

## 2015-12-24 DIAGNOSIS — Y999 Unspecified external cause status: Secondary | ICD-10-CM | POA: Diagnosis not present

## 2015-12-24 DIAGNOSIS — Y92018 Other place in single-family (private) house as the place of occurrence of the external cause: Secondary | ICD-10-CM | POA: Insufficient documentation

## 2015-12-24 DIAGNOSIS — Z87891 Personal history of nicotine dependence: Secondary | ICD-10-CM | POA: Diagnosis not present

## 2015-12-24 DIAGNOSIS — Y93H3 Activity, building and construction: Secondary | ICD-10-CM | POA: Insufficient documentation

## 2015-12-24 DIAGNOSIS — X503XXA Overexertion from repetitive movements, initial encounter: Secondary | ICD-10-CM | POA: Insufficient documentation

## 2015-12-24 DIAGNOSIS — M5441 Lumbago with sciatica, right side: Secondary | ICD-10-CM

## 2015-12-24 DIAGNOSIS — M5116 Intervertebral disc disorders with radiculopathy, lumbar region: Secondary | ICD-10-CM | POA: Insufficient documentation

## 2015-12-24 DIAGNOSIS — S39012A Strain of muscle, fascia and tendon of lower back, initial encounter: Secondary | ICD-10-CM | POA: Insufficient documentation

## 2015-12-24 DIAGNOSIS — M5136 Other intervertebral disc degeneration, lumbar region: Secondary | ICD-10-CM

## 2015-12-24 LAB — URINALYSIS COMPLETE WITH MICROSCOPIC (ARMC ONLY)
Bacteria, UA: NONE SEEN
Bilirubin Urine: NEGATIVE
Glucose, UA: NEGATIVE mg/dL
Ketones, ur: NEGATIVE mg/dL
Leukocytes, UA: NEGATIVE
Nitrite: NEGATIVE
Protein, ur: NEGATIVE mg/dL
Specific Gravity, Urine: 1.017 (ref 1.005–1.030)
Squamous Epithelial / LPF: NONE SEEN
pH: 5 (ref 5.0–8.0)

## 2015-12-24 MED ORDER — NAPROXEN SODIUM 275 MG PO TABS: 275.0000 mg | ORAL_TABLET | Freq: Two times a day (BID) | ORAL | 0 refills | Status: DC | PRN

## 2015-12-24 MED ORDER — NAPROXEN 500 MG PO TABS
500.0000 mg | ORAL_TABLET | Freq: Once | ORAL | Status: AC
  Administered 2015-12-24: 500 mg via ORAL
  Filled 2015-12-24: qty 1

## 2015-12-24 MED ORDER — CYCLOBENZAPRINE HCL 5 MG PO TABS: 5.0000 mg | ORAL_TABLET | Freq: Three times a day (TID) | ORAL | 0 refills | Status: DC | PRN

## 2015-12-24 NOTE — ED Notes (Signed)
Pt unable to provide urine sample at this time. Pt given specimen cup and informed to notify staff when able to void.

## 2015-12-24 NOTE — ED Provider Notes (Signed)
[facility] Emergency Department Provider Note   ____________________________________________   First MD Initiated Contact with Patient 12/24/15 [phone]     (approximate)  I have reviewed the triage vital signs and the nursing notes.   HISTORY  Chief Complaint Back Pain    HPI CRANSTON KOORS is a 45 y.o. male with history of "back issues" who presents for evaluation of 3-4 days of atraumatic lumbar back pain, gradual onset, moderate to severe and worse with movement, constant since onset. 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 He denies any known falls or injuries to the back. He denies any loss of control of bowel or bladder function, no fevers, no numbness or weakness in the legs, no history of IV drug use. Contrary to the initial triage note, he denies any abdominal pain or radiation of his back pain. He reports that this feels like "a pinched nerve type of pain". He denies any dysuria or hematuria.   History reviewed. No pertinent past medical history.  There are no active problems to display for this patient.   History reviewed. No pertinent surgical history.  Prior to Admission medications   Medication Sig Start Date End Date Taking? Authorizing Provider  benzonatate (TESSALON PERLES) 100 MG capsule Take 1 capsule (100 mg total) by mouth every 6 (six) hours as needed for cough. 05/14/15 05/13/16  Jene Every, MD  nabumetone (RELAFEN) 750 MG tablet Take 1 tablet (750 mg total) by mouth 2 (two) times daily. 11/28/14   Jenise V Bacon Menshew, PA-C  naproxen sodium (ANAPROX) 275 MG tablet Take 1 tablet (275 mg total) by mouth 2 (two) times daily with a meal. 02/14/15 02/14/16  Joni Reining, PA-C  pseudoephedrine (SUDAFED) 30 MG tablet Take 1 tablet (30 mg total) by mouth every 6 (six) hours as needed for congestion. 03/14/15   Rebecka Apley, MD  traMADol (ULTRAM) 50 MG tablet Take 1 tablet (50 mg  total) by mouth 2 (two) times daily. 11/28/14   Jenise V Bacon Menshew, PA-C  traMADol (ULTRAM) 50 MG tablet Take 1 tablet (50 mg total) by mouth every 6 (six) hours as needed. 02/14/15 02/14/16  Joni Reining, PA-C    Allergies Review of patient's allergies indicates no known allergies.  No family history on file.  Social History Social History  Substance Use Topics  . Smoking status: Former Games developer  . Smokeless tobacco: Never Used  . Alcohol use No    Review of Systems Constitutional: No fever/chills Eyes: No visual changes. ENT: No sore throat. Cardiovascular: Denies chest pain. Respiratory: Denies shortness of breath. Gastrointestinal: No abdominal pain.  No nausea, no vomiting.  No diarrhea.  No constipation. Genitourinary: Negative for dysuria. Musculoskeletal: Positive for back pain. Skin: Negative for rash. Neurological: Negative for headaches, focal weakness or numbness.  10-point ROS otherwise negative.  ____________________________________________   PHYSICAL EXAM:  VITAL SIGNS: ED Triage Vitals  Enc Vitals Group     BP 12/24/15 0138 (!) 123/91     Pulse Rate 12/24/15 0138 71     Resp 12/24/15 0138 18     Temp 12/24/15 0138 98.2 F (36.8 C)     Temp Source 12/24/15 0138 Oral     SpO2 12/24/15 0138 99 %     Weight 12/24/15 0134 240 lb (108.9 kg)     Height 12/24/15 0134 5\' 11"  (1.803 m)     Head Circumference --  Peak Flow --      Pain Score 12/24/15 0134 10     Pain Loc --      Pain Edu? --      Excl. in GC? --     Constitutional: Alert and oriented. Well appearing and in no acute distress.Sitting up in bed, eating chocolate candy and drinking soda. Eyes: Conjunctivae are normal. PERRL. EOMI. Head: Atraumatic. Nose: No congestion/rhinnorhea. Mouth/Throat: Mucous membranes are moist.  Oropharynx non-erythematous. Neck: No stridor.  No midline C-spine tenderness to palpation. Cardiovascular: Normal rate, regular rhythm. Grossly normal heart  sounds.  Good peripheral circulation. Respiratory: Normal respiratory effort.  No retractions. Lungs CTAB. Gastrointestinal: Soft and nontender. No distention.  No CVA tenderness. Genitourinary: Deferred Musculoskeletal: No lower extremity tenderness nor edema.  No joint effusions. Moderate tenderness to palpation in the midline of the lumbar spine as well as in the paravertebral muscles bilaterally associated with the lumbar spine. Neurologic:  Normal speech and language. No gross focal neurologic deficits are appreciated. No gait instability. 5 out of 5 strength of dorsiflexion of the big toes bilaterally. Normal sensation in the saddle distribution. Positive straight leg raise at 45 in the right leg. Skin:  Skin is warm, dry and intact. No rash noted. Psychiatric: Mood and affect are normal. Speech and behavior are normal.  ____________________________________________   LABS (all labs ordered are listed, but only abnormal results are displayed)  Labs Reviewed  URINALYSIS COMPLETEWITH MICROSCOPIC ([facility] ONLY) - Abnormal; Notable for the following:       Result Value   Color, Urine YELLOW (*)    APPearance CLEAR (*)    Hgb urine dipstick 1+ (*)    All other components within normal limits   ____________________________________________  EKG  none ____________________________________________  RADIOLOGY  Xray lumbar IMPRESSION: Minimal degenerative disc disease at L1-L2. No acute bony abnormality. ____________________________________________   PROCEDURES  Procedure(s) performed: None  Procedures  Critical Care performed: No  ____________________________________________   INITIAL IMPRESSION / ASSESSMENT AND PLAN / ED COURSE  Pertinent labs & imaging results that were available during my care of the patient were reviewed by me and considered in my medical decision making (see chart for details).  KRAIG GENIS is a 46 y.o. male with history of "back issues" who  presents for evaluation of 3-4 days of atraumatic lumbar back pain. On exam, he is very well-appearing and in no acute distress. Vital signs stable and he is afebrile. He has an intact neurological examination. No red flags concerning for cauda equina or epidural abscess. Suspect muscle strain though there may also be a component of sciatica. Given his midline tenderness, we'll obtain plain films, treat him symptomatically and reassess for disposition.  ----------------------------------------- 6:09 AM on 12/24/2015 ----------------------------------------- Patient reports improvement of his pain at this time. Plain films show evidence of degenerative disc disease. I discussed with him that I suspect his pain is a combination of degenerative disc disease as well as lumbar strain, possibly right-sided sciatica. We discussed return precautions and need for close PCP follow-up and he is comfortable with the discharge plan. DC with naproxen and Flexeril.  Clinical Course     ____________________________________________   FINAL CLINICAL IMPRESSION(S) / ED DIAGNOSES  Final diagnoses:  Acute bilateral low back pain with right-sided sciatica  Lumbar strain, initial encounter  Degenerative disc disease, lumbar      NEW MEDICATIONS STARTED DURING THIS VISIT:  New Prescriptions   No medications on file     Note:  This  document was prepared using Conservation officer, historic buildingsDragon voice recognition software and may include unintentional dictation errors.    Gayla DossEryka A Braeley Buskey, MD 12/24/15 [phone]

## 2015-12-24 NOTE — ED Triage Notes (Signed)
Pt presents to ED with c/o lower back pain x2 days with radiation into the RIGHT lower abdomen starting today. Pt reports decrease in urinary frequency; denies painful or burning urination. Pt denies hematuria or h/x of kidney stones.

## 2015-12-24 NOTE — ED Notes (Addendum)
Pt states mid low back pain along lumbar spine. Pt states "i have back problems, but over the last couple of days it's gotten worse." pt eating candy and drinking coke during assessment. Pt denies fever, hematuria, loss of bowel or bladder, or change in sensation to extremities. Pt ambulatory without difficulty at this time. Pt denies pain radiation at this time but states earlier was radiating to bilateral lower quadrants and flanks. Pt appears in no acute distress, skin pwd, resps unlabored.

## 2016-04-26 ENCOUNTER — Emergency Department
Admission: EM | Admit: 2016-04-26 | Discharge: 2016-04-26 | Disposition: A | Discharge status: Home / Self Care | Payer: Medicaid Other | Attending: Emergency Medicine | Admitting: Emergency Medicine

## 2016-04-26 ENCOUNTER — Encounter: Payer: Self-pay | Admitting: Emergency Medicine

## 2016-04-26 ENCOUNTER — Emergency Department: Payer: Medicaid Other

## 2016-04-26 DIAGNOSIS — R1011 Right upper quadrant pain: Secondary | ICD-10-CM | POA: Diagnosis not present

## 2016-04-26 DIAGNOSIS — R1031 Right lower quadrant pain: Secondary | ICD-10-CM | POA: Insufficient documentation

## 2016-04-26 DIAGNOSIS — Z791 Long term (current) use of non-steroidal anti-inflammatories (NSAID): Secondary | ICD-10-CM | POA: Diagnosis not present

## 2016-04-26 DIAGNOSIS — F17220 Nicotine dependence, chewing tobacco, uncomplicated: Secondary | ICD-10-CM | POA: Diagnosis not present

## 2016-04-26 LAB — CBC
HCT: 41.2 % (ref 40.0–52.0)
Hemoglobin: 13.9 g/dL (ref 13.0–18.0)
MCH: 28.5 pg (ref 26.0–34.0)
MCHC: 33.8 g/dL (ref 32.0–36.0)
MCV: 84.4 fL (ref 80.0–100.0)
Platelets: 194 10*3/uL (ref 150–440)
RBC: 4.88 MIL/uL (ref 4.40–5.90)
RDW: 14.3 % (ref 11.5–14.5)
WBC: 7.9 10*3/uL (ref 3.8–10.6)

## 2016-04-26 LAB — COMPREHENSIVE METABOLIC PANEL
ALT: 27 U/L (ref 17–63)
AST: 30 U/L (ref 15–41)
Albumin: 3.7 g/dL (ref 3.5–5.0)
Alkaline Phosphatase: 52 U/L (ref 38–126)
Anion gap: 5 (ref 5–15)
BUN: 15 mg/dL (ref 6–20)
CO2: 28 mmol/L (ref 22–32)
Calcium: 9 mg/dL (ref 8.9–10.3)
Chloride: 106 mmol/L (ref 101–111)
Creatinine, Ser: 0.97 mg/dL (ref 0.61–1.24)
GFR calc Af Amer: >60 mL/min (ref >60)
GFR calc non Af Amer: >60 mL/min (ref >60)
Glucose, Bld: 99 mg/dL (ref 65–99)
Potassium: 4 mmol/L (ref 3.5–5.1)
Sodium: 139 mmol/L (ref 135–145)
Total Bilirubin: 0.4 mg/dL (ref 0.3–1.2)
Total Protein: 7.3 g/dL (ref 6.5–8.1)

## 2016-04-26 LAB — URINALYSIS, COMPLETE (UACMP) WITH MICROSCOPIC
Bacteria, UA: NONE SEEN
Bilirubin Urine: NEGATIVE
Glucose, UA: NEGATIVE mg/dL
Ketones, ur: NEGATIVE mg/dL
Leukocytes, UA: NEGATIVE
Nitrite: NEGATIVE
Protein, ur: NEGATIVE mg/dL
Specific Gravity, Urine: 1.006 (ref 1.005–1.030)
Squamous Epithelial / LPF: NONE SEEN
pH: 6 (ref 5.0–8.0)

## 2016-04-26 LAB — LIPASE, BLOOD: Lipase: 27 U/L (ref 11–51)

## 2016-04-26 MED ORDER — ONDANSETRON HCL 4 MG/2ML IJ SOLN
INTRAMUSCULAR | Status: AC
  Filled 2016-04-26: qty 2

## 2016-04-26 MED ORDER — SODIUM CHLORIDE 0.9 % IV SOLN
8.0000 mg | Freq: Once | INTRAVENOUS | Status: DC
  Filled 2016-04-26: qty 4

## 2016-04-26 MED ORDER — DICYCLOMINE HCL 10 MG PO CAPS
20.0000 mg | ORAL_CAPSULE | Freq: Once | ORAL | Status: AC
  Administered 2016-04-26: 20 mg via ORAL
  Filled 2016-04-26: qty 2

## 2016-04-26 MED ORDER — KETOROLAC TROMETHAMINE 30 MG/ML IJ SOLN
15.0000 mg | Freq: Once | INTRAMUSCULAR | Status: AC
  Administered 2016-04-26: 15 mg via INTRAVENOUS
  Filled 2016-04-26: qty 1

## 2016-04-26 MED ORDER — IOPAMIDOL (ISOVUE-300) INJECTION 61%
30.0000 mL | Freq: Once | INTRAVENOUS | Status: AC
  Administered 2016-04-26: 30 mL via ORAL

## 2016-04-26 MED ORDER — DICYCLOMINE HCL 20 MG PO TABS: 20.0000 mg | ORAL_TABLET | Freq: Three times a day (TID) | ORAL | 0 refills | Status: DC | PRN

## 2016-04-26 MED ORDER — SODIUM CHLORIDE 0.9 % IV BOLUS (SEPSIS)
1000.0000 mL | Freq: Once | INTRAVENOUS | Status: AC
  Administered 2016-04-26: 1000 mL via INTRAVENOUS

## 2016-04-26 MED ORDER — ONDANSETRON HCL 4 MG/2ML IJ SOLN
4.0000 mg | Freq: Once | INTRAMUSCULAR | Status: AC
  Administered 2016-04-26: 4 mg via INTRAVENOUS

## 2016-04-26 MED ORDER — MORPHINE SULFATE (PF) 4 MG/ML IV SOLN
6.0000 mg | Freq: Once | INTRAVENOUS | Status: AC
  Administered 2016-04-26: 6 mg via INTRAVENOUS
  Filled 2016-04-26: qty 2

## 2016-04-26 MED ORDER — IBUPROFEN 600 MG PO TABS: 600.0000 mg | ORAL_TABLET | Freq: Three times a day (TID) | ORAL | 0 refills | Status: DC | PRN

## 2016-04-26 MED ORDER — IOPAMIDOL (ISOVUE-300) INJECTION 61%
100.0000 mL | Freq: Once | INTRAVENOUS | Status: AC | PRN
  Administered 2016-04-26: 100 mL via INTRAVENOUS

## 2016-04-26 NOTE — ED Triage Notes (Signed)
Pain right side mid abd since Monday.  Has not eased off at all.

## 2016-04-26 NOTE — ED Provider Notes (Signed)
[facility] Emergency Department Provider Note  ____________________________________________   First MD Initiated Contact with Patient 04/26/16 [phone]     (approximate)  I have reviewed the triage vital signs and the nursing notes.   HISTORY  Chief Complaint Abdominal Pain    HPI Mike Herrera is a 46 y.o. male presents to the emergency department with 2 days of moderate severity and aching discomfort in his right lower quadrant/right flank. Pain is nonradiating and constant. Nothing makes it better or worse. No fevers or chills. Mild nausea no vomiting. No chest pain or shortness of breath. No dysuria frequency hesitancy or back pain. No hematuria.    History reviewed. No pertinent past medical history.  There are no active problems to display for this patient.   History reviewed. No pertinent surgical history.  Prior to Admission medications   Medication Sig Start Date End Date Taking? Authorizing Provider  acetaminophen (TYLENOL) 500 MG tablet Take 1,000 mg by mouth every 6 (six) hours as needed.   Yes Historical Provider, MD  cyclobenzaprine (FLEXERIL) 5 MG tablet Take 1 tablet (5 mg total) by mouth 3 (three) times daily as needed for muscle spasms. Do not drive while taking this medication. 12/24/15  Yes Gayla Doss, MD  HYDROcodone-acetaminophen (NORCO/VICODIN) 5-325 MG tablet Take 1 tablet by mouth every 6 (six) hours as needed for moderate pain.   Yes Historical Provider, MD  naproxen sodium (ANAPROX) 275 MG tablet Take 1 tablet (275 mg total) by mouth 2 (two) times daily as needed for moderate pain. 12/24/15  Yes Gayla Doss, MD  benzonatate (TESSALON PERLES) 100 MG capsule Take 1 capsule (100 mg total) by mouth every 6 (six) hours as needed for cough. 05/14/15 05/13/16  Jene Every, MD  dicyclomine (BENTYL) 20 MG tablet Take 1 tablet (20 mg total) by mouth 3 (three) times daily as needed for spasms. 04/26/16 04/26/17  Merrily Brittle, MD    ibuprofen (ADVIL,MOTRIN) 600 MG tablet Take 1 tablet (600 mg total) by mouth every 8 (eight) hours as needed. 04/26/16   Merrily Brittle, MD  nabumetone (RELAFEN) 750 MG tablet Take 1 tablet (750 mg total) by mouth 2 (two) times daily. 11/28/14   Jenise V Bacon Menshew, PA-C  traMADol (ULTRAM) 50 MG tablet Take 1 tablet (50 mg total) by mouth 2 (two) times daily. 11/28/14   Charlesetta Ivory Menshew, PA-C    Allergies Patient has no known allergies.  No family history on file.  Social History Social History  Substance Use Topics  . Smoking status: Former Games developer  . Smokeless tobacco: Current User    Types: Chew  . Alcohol use No    Review of Systems Constitutional: No fever/chills Eyes: No visual changes. ENT: No sore throat. Cardiovascular: Denies chest pain. Respiratory: Denies shortness of breath. Gastrointestinal: Positive for abdominal pain Genitourinary: Negative for dysuria. Musculoskeletal: Negative for back pain. Skin: Negative for rash. Neurological: Negative for headaches, focal weakness or numbness.  10-point ROS otherwise negative.  ____________________________________________   PHYSICAL EXAM:  VITAL SIGNS: ED Triage Vitals  Enc Vitals Group     BP 04/26/16 0846 124/79     Pulse Rate 04/26/16 0846 73     Resp 04/26/16 0846 20     Temp 04/26/16 0846 98.4 F (36.9 C)     Temp Source 04/26/16 0846 Oral     SpO2 04/26/16 0846 96 %     Weight 04/26/16 0847 250 lb (113.4 kg)  Height 04/26/16 0847 5\' 11"  (1.803 m)     Head Circumference --      Peak Flow --      Pain Score 04/26/16 0851 7     Pain Loc --      Pain Edu? --      Excl. in GC? --     Constitutional: Alert and oriented 4 well-appearing nontoxic no diaphoresis speaks full clear sentences Eyes: Conjunctivae are normal. PERRL. EOMI. Head: Atraumatic. Nose: No congestion/rhinnorhea. Mouth/Throat: Mucous membranes are moist.  Oropharynx non-erythematous. Neck: No stridor.   Cardiovascular:  Normal rate, regular rhythm. Grossly normal heart sounds.  Good peripheral circulation. Respiratory: Normal respiratory effort.  No retractions. Lungs CTAB. Gastrointestinal: Soft nondistended mild right lower quadrant tenderness with no peritonitis mild rebound negative Rovsing's negative Murphy's vertebral tenderness Musculoskeletal: No lower extremity tenderness nor edema.  No joint effusions. Neurologic:  Normal speech and language. No gross focal neurologic deficits are appreciated. No gait instability. Skin:  Skin is warm, dry and intact. No rash noted. Psychiatric: Mood and affect are normal. Speech and behavior are normal.  ____________________________________________   LABS (all labs ordered are listed, but only abnormal results are displayed)  Labs Reviewed  URINALYSIS, COMPLETE (UACMP) WITH MICROSCOPIC - Abnormal; Notable for the following:       Result Value   Color, Urine STRAW (*)    APPearance CLEAR (*)    Hgb urine dipstick SMALL (*)    All other components within normal limits  LIPASE, BLOOD  COMPREHENSIVE METABOLIC PANEL  CBC   ____________________________________________  EKG   ____________________________________________  RADIOLOGY  No acute disease ____________________________________________   PROCEDURES  Procedure(s) performed: no  Procedures  Critical Care performed: no  ____________________________________________   INITIAL IMPRESSION / ASSESSMENT AND PLAN / ED COURSE  Pertinent labs & imaging results that were available during my care of the patient were reviewed by me and considered in my medical decision making (see chart for details).      ----------------------------------------- 1:04 PM on 04/26/2016 -----------------------------------------  The patient's pain is improved, his abdomen is benign, he is able to eat and drink, and his labs and CT scan are negative for acute pathology. CT does show uncomplicated cholelithiasis  and his urinalysis does show trace blood. His pain likely either represents a passed kidney stone versus biliary colic so I will treat him symptomatically now with nonsteroidals as well as dicyclomine and refer him back to his primary care physician. He is discharged home in good condition.  ____________________________________________   FINAL CLINICAL IMPRESSION(S) / ED DIAGNOSES  Final diagnoses:  Right lower quadrant abdominal pain  Right upper quadrant abdominal pain       NEW MEDICATIONS STARTED DURING THIS VISIT:  Discharge Medication List as of 04/26/2016 12:48 PM    START taking these medications   Details  dicyclomine (BENTYL) 20 MG tablet Take 1 tablet (20 mg total) by mouth 3 (three) times daily as needed for spasms., Starting Thu 04/26/2016, Until Fri 04/26/2017, Print    ibuprofen (ADVIL,MOTRIN) 600 MG tablet Take 1 tablet (600 mg total) by mouth every 8 (eight) hours as needed., Starting Thu 04/26/2016, Print         Note:  This document was prepared using Dragon voice recognition software and may include unintentional dictation errors.     Merrily BrittleNeil Aspin Palomarez, MD 04/27/16 1357

## 2016-04-26 NOTE — Discharge Instructions (Signed)
Please return to the emergency department for any new or worsening symptoms such as fevers, chills, worsening pain, if he cannot eat or drink, or for any other concerns. Otherwise follow up with your primary care physician within one week for recheck.  Ct Abdomen Pelvis W Contrast  Result Date: 04/26/2016 CLINICAL DATA:  Right lower quadrant pain for 2 days, initial encounter EXAM: CT ABDOMEN AND PELVIS WITH CONTRAST TECHNIQUE: Multidetector CT imaging of the abdomen and pelvis was performed using the standard protocol following bolus administration of intravenous contrast. CONTRAST:  100mL ISOVUE-300 IOPAMIDOL (ISOVUE-300) INJECTION 61% COMPARISON:  None. FINDINGS: Lower chest: Few calcified granulomas are noted in the lung bases. Calcified hilar lymph nodes are noted on the left. Hepatobiliary: Diffuse fatty infiltration of the liver is seen. The gallbladder demonstrates few dependent densities consistent with gallstones. No complicating factors are seen. Pancreas: Unremarkable. No pancreatic ductal dilatation or surrounding inflammatory changes. Spleen: Multiple splenic granulomas are noted. The spleen is otherwise within normal limits. Adrenals/Urinary Tract: The adrenal glands are unremarkable. A few small renal cysts are seen. No renal calculi or obstructive changes are noted. The bladder is well distended. Stomach/Bowel: Stomach is within normal limits. Appendix appears normal. No evidence of bowel wall thickening, distention, or inflammatory changes. Vascular/Lymphatic: No significant vascular findings are present. No enlarged abdominal or pelvic lymph nodes. Reproductive: Prostate is unremarkable. Other: No abdominal wall hernia or abnormality. No abdominopelvic ascites. Musculoskeletal: No acute or significant osseous findings. IMPRESSION: Cholelithiasis without complicating factors. Changes of prior granulomatous disease. Fatty infiltration of the liver. Electronically Signed   By: Alcide CleverMark  Lukens M.D.    On: 04/26/2016 10:52

## 2016-04-26 NOTE — ED Provider Notes (Signed)
   [facility] Emergency Department Provider Note   This note was created in error.    ____________________________________________   ____________________________________________     Procedures   ____________________________________________       ____________________________________________     Merrily BrittleNeil Sarabella Caprio, MD 04/27/16 [phone]

## 2016-10-20 ENCOUNTER — Emergency Department
Admission: EM | Admit: 2016-10-20 | Discharge: 2016-10-20 | Disposition: A | Discharge status: Home / Self Care | Payer: Medicaid Other | Attending: Emergency Medicine | Admitting: Emergency Medicine

## 2016-10-20 ENCOUNTER — Encounter: Payer: Self-pay | Admitting: Emergency Medicine

## 2016-10-20 DIAGNOSIS — J020 Streptococcal pharyngitis: Secondary | ICD-10-CM | POA: Diagnosis not present

## 2016-10-20 DIAGNOSIS — J029 Acute pharyngitis, unspecified: Secondary | ICD-10-CM | POA: Diagnosis present

## 2016-10-20 DIAGNOSIS — Z79899 Other long term (current) drug therapy: Secondary | ICD-10-CM | POA: Diagnosis not present

## 2016-10-20 DIAGNOSIS — F1920 Other psychoactive substance dependence, uncomplicated: Secondary | ICD-10-CM | POA: Diagnosis not present

## 2016-10-20 MED ORDER — LIDOCAINE VISCOUS 2 % MT SOLN: 10.0000 mL | OROMUCOSAL | 0 refills | Status: DC | PRN

## 2016-10-20 MED ORDER — LIDOCAINE VISCOUS 2 % MT SOLN
15.0000 mL | Freq: Once | OROMUCOSAL | Status: AC
  Administered 2016-10-20: 15 mL via OROMUCOSAL
  Filled 2016-10-20: qty 15

## 2016-10-20 MED ORDER — AMOXICILLIN 500 MG PO CAPS: 500.0000 mg | ORAL_CAPSULE | Freq: Two times a day (BID) | ORAL | 0 refills | Status: DC

## 2016-10-20 NOTE — ED Triage Notes (Addendum)
Pt c/o left ear and sore throat X 3 days.  No distress. Talking without difficulty.  No drainage from ear.  Subjective fevers.pt reports feels like when gets strep throat

## 2016-10-20 NOTE — ED Provider Notes (Signed)
[facility] Emergency Department Provider Note  ____________________________________________  Time seen: Approximately 2:44 PM  I have reviewed the triage vital signs and the nursing notes.   HISTORY  Chief Complaint Sore Throat    HPI JOYCE LECKEY is a 46 y.o. male that presents to the emergency department with left ear pain and throat pain for 3 days. Patient states that it feels like when he gets strep throat but he does not have any white patches on his tonsils. Daughter is sick with a fever. No fever, nasal congestion, cough, shortness breath, chest pain, nausea, vomiting, abdominal pain.   History reviewed. No pertinent past medical history.  There are no active problems to display for this patient.   History reviewed. No pertinent surgical history.  Prior to Admission medications   Medication Sig Start Date End Date Taking? Authorizing Provider  acetaminophen (TYLENOL) 500 MG tablet Take 1,000 mg by mouth every 6 (six) hours as needed.    [provider]  amoxicillin (AMOXIL) 500 MG capsule Take 1 capsule (500 mg total) by mouth 2 (two) times daily. 10/20/16   Enid Derry, PA-C  cyclobenzaprine (FLEXERIL) 5 MG tablet Take 1 tablet (5 mg total) by mouth 3 (three) times daily as needed for muscle spasms. Do not drive while taking this medication. 12/24/15   Gayla Doss, MD  dicyclomine (BENTYL) 20 MG tablet Take 1 tablet (20 mg total) by mouth 3 (three) times daily as needed for spasms. 04/26/16 04/26/17  Merrily Brittle, MD  HYDROcodone-acetaminophen (NORCO/VICODIN) 5-325 MG tablet Take 1 tablet by mouth every 6 (six) hours as needed for moderate pain.    [provider]  ibuprofen (ADVIL,MOTRIN) 600 MG tablet Take 1 tablet (600 mg total) by mouth every 8 (eight) hours as needed. 04/26/16   Merrily Brittle, MD  lidocaine (XYLOCAINE) 2 % solution Use as directed 10 mLs in the mouth or throat as needed for mouth pain. 10/20/16   Enid Derry, PA-C  nabumetone (RELAFEN) 750 MG tablet Take 1 tablet (750 mg total) by mouth 2 (two) times daily. 11/28/14   Menshew, Charlesetta Ivory, PA-C  naproxen sodium (ANAPROX) 275 MG tablet Take 1 tablet (275 mg total) by mouth 2 (two) times daily as needed for moderate pain. 12/24/15   Gayla Doss, MD  traMADol (ULTRAM) 50 MG tablet Take 1 tablet (50 mg total) by mouth 2 (two) times daily. 11/28/14   Menshew, Charlesetta Ivory, PA-C    Allergies Patient has no known allergies.  History reviewed. No pertinent family history.  Social History Social History  Substance Use Topics  . Smoking status: Former Games developer  . Smokeless tobacco: Current User    Types: Chew  . Alcohol use No     Review of Systems  Constitutional: No fever/chills Cardiovascular: No chest pain. Respiratory:  No SOB. Gastrointestinal: No abdominal pain.  No nausea, no vomiting.  Musculoskeletal: Negative for musculoskeletal pain. Neurological: Negative for headaches, numbness or tingling   ____________________________________________   PHYSICAL EXAM:  VITAL SIGNS: ED Triage Vitals  Enc Vitals Group     BP 10/20/16 1313 (!) 148/94     Pulse Rate 10/20/16 1313 88     Resp 10/20/16 1313 18     Temp 10/20/16 1313 98.2 F (36.8 C)     Temp Source 10/20/16 1313 Oral     SpO2 10/20/16 1313 98 %     Weight 10/20/16 1309 250 lb (113.4 kg)     Height  10/20/16 1309 5\' 11"  (1.803 m)     Head Circumference --      Peak Flow --      Pain Score 10/20/16 1309 8     Pain Loc --      Pain Edu? --      Excl. in GC? --      Constitutional: Alert and oriented. Well appearing and in no acute distress. Eyes: Conjunctivae are normal. PERRL. EOMI. Head: Atraumatic. ENT:      Ears: Tympanic membranes pearly gray. No drainage.      Nose: No congestion/rhinnorhea.      Mouth/Throat: Mucous membranes are moist. Oropharynx erythematous. Left tonsil mildly enlarged. No exudates. Uvula midline. Neck: No  stridor. Cardiovascular: Normal rate, regular rhythm.  Good peripheral circulation. Respiratory: Normal respiratory effort without tachypnea or retractions. Lungs CTAB. Good air entry to the bases with no decreased or absent breath sounds. Gastrointestinal: Bowel sounds 4 quadrants. Soft and nontender to palpation. No guarding or rigidity. No palpable masses. No distention.  Musculoskeletal: Full range of motion to all extremities. No gross deformities appreciated. Neurologic:  Normal speech and language. No gross focal neurologic deficits are appreciated.  Skin:  Skin is warm, dry and intact. No rash noted.  ____________________________________________   LABS (all labs ordered are listed, but only abnormal results are displayed)  Labs Reviewed - No data to display ____________________________________________  EKG   ____________________________________________  RADIOLOGY  No results found.  ____________________________________________    PROCEDURES  Procedure(s) performed:    Procedures    Medications  lidocaine (XYLOCAINE) 2 % viscous mouth solution 15 mL (15 mLs Mouth/Throat Given 10/20/16 1434)     ____________________________________________   INITIAL IMPRESSION / ASSESSMENT AND PLAN / ED COURSE  Pertinent labs & imaging results that were available during my care of the patient were reviewed by me and considered in my medical decision making (see chart for details).  Review of the [place] CSRS was performed in accordance of the [facility] prior to dispensing any controlled drugs.   Patient's diagnosis is consistent with strep pharyngitis. Vital signs and exam are reassuring. Strep test positive. Patient will be discharged home with prescriptions for amoxicillin and viscous lidocaine. Patient is to follow up with PCP as directed. Patient is given ED precautions to return to the ED for any worsening or new  symptoms.     ____________________________________________  FINAL CLINICAL IMPRESSION(S) / ED DIAGNOSES  Final diagnoses:  Strep throat      NEW MEDICATIONS STARTED DURING THIS VISIT:  Discharge Medication List as of 10/20/2016  3:12 PM    START taking these medications   Details  amoxicillin (AMOXIL) 500 MG capsule Take 1 capsule (500 mg total) by mouth 2 (two) times daily., Starting Sat 10/20/2016, Print    lidocaine (XYLOCAINE) 2 % solution Use as directed 10 mLs in the mouth or throat as needed for mouth pain., Starting Sat 10/20/2016, Print            This chart was dictated using voice recognition software/Dragon. Despite best efforts to proofread, errors can occur which can change the meaning. Any change was purely unintentional.    Enid DerryWagner, Madora Barletta, PA-C 10/20/16 1611    Minna AntisPaduchowski, Kevin, MD 10/21/16 [phone]

## 2016-10-20 NOTE — ED Triage Notes (Signed)
FIRST NURSE NOTE-ambulatory without distress.  Sore throat.

## 2016-10-28 ENCOUNTER — Emergency Department: Payer: Medicaid Other

## 2016-10-28 ENCOUNTER — Emergency Department
Admission: EM | Admit: 2016-10-28 | Discharge: 2016-10-28 | Disposition: A | Discharge status: Home / Self Care | Payer: Medicaid Other | Attending: Emergency Medicine | Admitting: Emergency Medicine

## 2016-10-28 ENCOUNTER — Encounter: Payer: Self-pay | Admitting: Emergency Medicine

## 2016-10-28 DIAGNOSIS — R1031 Right lower quadrant pain: Secondary | ICD-10-CM

## 2016-10-28 DIAGNOSIS — Z87891 Personal history of nicotine dependence: Secondary | ICD-10-CM | POA: Diagnosis not present

## 2016-10-28 DIAGNOSIS — K2900 Acute gastritis without bleeding: Secondary | ICD-10-CM

## 2016-10-28 DIAGNOSIS — R1011 Right upper quadrant pain: Secondary | ICD-10-CM

## 2016-10-28 DIAGNOSIS — K219 Gastro-esophageal reflux disease without esophagitis: Secondary | ICD-10-CM | POA: Diagnosis not present

## 2016-10-28 DIAGNOSIS — R1013 Epigastric pain: Secondary | ICD-10-CM | POA: Diagnosis not present

## 2016-10-28 LAB — URINALYSIS, COMPLETE (UACMP) WITH MICROSCOPIC
Bacteria, UA: NONE SEEN
Bilirubin Urine: NEGATIVE
Glucose, UA: NEGATIVE mg/dL
Ketones, ur: NEGATIVE mg/dL
Leukocytes, UA: NEGATIVE
Nitrite: NEGATIVE
Protein, ur: NEGATIVE mg/dL
Specific Gravity, Urine: 1.014 (ref 1.005–1.030)
pH: 6 (ref 5.0–8.0)

## 2016-10-28 LAB — TROPONIN I: Troponin I: <0.03 ng/mL (ref <0.03)

## 2016-10-28 LAB — COMPREHENSIVE METABOLIC PANEL
ALT: 23 U/L (ref 17–63)
AST: 31 U/L (ref 15–41)
Albumin: 3.8 g/dL (ref 3.5–5.0)
Alkaline Phosphatase: 58 U/L (ref 38–126)
Anion gap: 8 (ref 5–15)
BUN: 12 mg/dL (ref 6–20)
CO2: 27 mmol/L (ref 22–32)
Calcium: 8.7 mg/dL — ABNORMAL LOW (ref 8.9–10.3)
Chloride: 106 mmol/L (ref 101–111)
Creatinine, Ser: 0.93 mg/dL (ref 0.61–1.24)
GFR calc Af Amer: >60 mL/min (ref >60)
GFR calc non Af Amer: >60 mL/min (ref >60)
Glucose, Bld: 112 mg/dL — ABNORMAL HIGH (ref 65–99)
Potassium: 3.1 mmol/L — ABNORMAL LOW (ref 3.5–5.1)
Sodium: 141 mmol/L (ref 135–145)
Total Bilirubin: 0.6 mg/dL (ref 0.3–1.2)
Total Protein: 7.2 g/dL (ref 6.5–8.1)

## 2016-10-28 LAB — CBC
HCT: 42.1 % (ref 40.0–52.0)
Hemoglobin: 14.5 g/dL (ref 13.0–18.0)
MCH: 29.1 pg (ref 26.0–34.0)
MCHC: 34.3 g/dL (ref 32.0–36.0)
MCV: 84.8 fL (ref 80.0–100.0)
Platelets: 219 10*3/uL (ref 150–440)
RBC: 4.97 MIL/uL (ref 4.40–5.90)
RDW: 14.3 % (ref 11.5–14.5)
WBC: 10.9 10*3/uL — ABNORMAL HIGH (ref 3.8–10.6)

## 2016-10-28 LAB — LIPASE, BLOOD: Lipase: 30 U/L (ref 11–51)

## 2016-10-28 MED ORDER — GI COCKTAIL ~~LOC~~
30.0000 mL | Freq: Once | ORAL | Status: AC
Start: 2016-10-28 — End: 2016-10-28
  Administered 2016-10-28: 30 mL via ORAL
  Filled 2016-10-28: qty 30

## 2016-10-28 MED ORDER — SODIUM CHLORIDE 0.9 % IV BOLUS (SEPSIS)
1000.0000 mL | Freq: Once | INTRAVENOUS | Status: AC
  Administered 2016-10-28: 1000 mL via INTRAVENOUS

## 2016-10-28 MED ORDER — IOPAMIDOL (ISOVUE-300) INJECTION 61%
30.0000 mL | Freq: Once | INTRAVENOUS | Status: AC | PRN
  Administered 2016-10-28: 30 mL via ORAL

## 2016-10-28 MED ORDER — OXYCODONE-ACETAMINOPHEN 5-325 MG PO TABS
1.0000 | ORAL_TABLET | ORAL | Status: DC | PRN
  Administered 2016-10-28: 1 via ORAL
  Filled 2016-10-28: qty 1

## 2016-10-28 MED ORDER — IOPAMIDOL (ISOVUE-300) INJECTION 61%
100.0000 mL | Freq: Once | INTRAVENOUS | Status: AC | PRN
  Administered 2016-10-28: 100 mL via INTRAVENOUS

## 2016-10-28 MED ORDER — MORPHINE SULFATE (PF) 4 MG/ML IV SOLN
4.0000 mg | Freq: Once | INTRAVENOUS | Status: AC
  Administered 2016-10-28: 4 mg via INTRAVENOUS
  Filled 2016-10-28: qty 1

## 2016-10-28 MED ORDER — ONDANSETRON HCL 4 MG/2ML IJ SOLN
4.0000 mg | Freq: Once | INTRAMUSCULAR | Status: AC
  Administered 2016-10-28: 4 mg via INTRAVENOUS
  Filled 2016-10-28: qty 2

## 2016-10-28 NOTE — ED Notes (Signed)
ED Provider at bedside. 

## 2016-10-28 NOTE — ED Provider Notes (Signed)
[facility] Emergency Department Provider Note ____________________________________________   I have reviewed the triage vital signs and the triage nursing note.  HISTORY  Chief Complaint Abdominal Pain and Nausea   Historian Patient   HPI Mike Herrera is a 46 y.o. male presenting for about 2 days of right-sided abdominal pain. Patient describes the location under the right rib. Seems to be worse when he eats. Described as burning. This also nausea without vomiting.  He's had a little bit of diarrhea.  Never had this before.  About a week ago he had strep throat.  Patient is currently out of 10, worse he states he felt like he could punch somebody.   History reviewed. No pertinent past medical history.  There are no active problems to display for this patient.   History reviewed. No pertinent surgical history.  Prior to Admission medications   Medication Sig Start Date End Date Taking? Authorizing Provider  acetaminophen (TYLENOL) 500 MG tablet Take 1,000 mg by mouth every 6 (six) hours as needed.   Yes [provider]  amoxicillin (AMOXIL) 500 MG capsule Take 1 capsule (500 mg total) by mouth 2 (two) times daily. Patient not taking: Reported on 10/28/2016 10/20/16   Enid Derry, PA-C  cyclobenzaprine (FLEXERIL) 5 MG tablet Take 1 tablet (5 mg total) by mouth 3 (three) times daily as needed for muscle spasms. Do not drive while taking this medication. Patient not taking: Reported on 10/28/2016 12/24/15   Gayla Doss, MD  dicyclomine (BENTYL) 20 MG tablet Take 1 tablet (20 mg total) by mouth 3 (three) times daily as needed for spasms. Patient not taking: Reported on 10/28/2016 04/26/16 04/26/17  Merrily Brittle, MD  ibuprofen (ADVIL,MOTRIN) 600 MG tablet Take 1 tablet (600 mg total) by mouth every 8 (eight) hours as needed. Patient not taking: Reported on 10/28/2016 04/26/16   Merrily Brittle, MD  lidocaine (XYLOCAINE) 2 % solution Use as  directed 10 mLs in the mouth or throat as needed for mouth pain. Patient not taking: Reported on 10/28/2016 10/20/16   Enid Derry, PA-C  nabumetone (RELAFEN) 750 MG tablet Take 1 tablet (750 mg total) by mouth 2 (two) times daily. Patient not taking: Reported on 10/28/2016 11/28/14   Menshew, Charlesetta Ivory, PA-C  naproxen sodium (ANAPROX) 275 MG tablet Take 1 tablet (275 mg total) by mouth 2 (two) times daily as needed for moderate pain. Patient not taking: Reported on 10/28/2016 12/24/15   Gayla Doss, MD  traMADol (ULTRAM) 50 MG tablet Take 1 tablet (50 mg total) by mouth 2 (two) times daily. Patient not taking: Reported on 10/28/2016 11/28/14   Menshew, Charlesetta Ivory, PA-C    No Known Allergies  No family history on file.  Social History Social History  Substance Use Topics  . Smoking status: Former Games developer  . Smokeless tobacco: Current User    Types: Chew  . Alcohol use Yes     Comment: occ    Review of Systems  Constitutional: Negative for fever. Eyes: Negative for visual changes. ENT: Negative for sore throat. Cardiovascular: Negative for chest pain. Respiratory: Negative for shortness of breath. Gastrointestinal: Negative for vomiting. Genitourinary: Negative for dysuria. Musculoskeletal: Negative for back pain. Skin: Negative for rash. Neurological: Negative for headache.  ____________________________________________   PHYSICAL EXAM:  VITAL SIGNS: ED Triage Vitals  Enc Vitals Group     BP 10/28/16 0340 135/85     Pulse Rate 10/28/16 0340 (!) 56     Resp --  Temp 10/28/16 0340 97.7 F (36.5 C)     Temp Source 10/28/16 0340 Oral     SpO2 10/28/16 0340 97 %     Weight 10/28/16 0340 250 lb (113.4 kg)     Height 10/28/16 0340 5\' 11"  (1.803 m)     Head Circumference --      Peak Flow --      Pain Score 10/28/16 0343 10     Pain Loc --      Pain Edu? --      Excl. in GC? --      Constitutional: Alert and oriented. Well appearing and in no  distress. HEENT   Head: Normocephalic and atraumatic.      Eyes: Conjunctivae are normal. Pupils equal and round.       Ears:         Nose: No congestion/rhinnorhea.   Mouth/Throat: Mucous membranes are moist.   Neck: No stridor. Cardiovascular/Chest: Normal rate, regular rhythm.  No murmurs, rubs, or gallops. Respiratory: Normal respiratory effort without tachypnea nor retractions. Breath sounds are clear and equal bilaterally. No wheezes/rales/rhonchi. Gastrointestinal: Soft. No distention, no guarding, no rebound. Nontender at epigastrium and ruq on palpation. Tender to palpation at right lower quadrant.  Genitourinary/rectal:Deferred Musculoskeletal: Nontender with normal range of motion in all extremities. No joint effusions.  No lower extremity tenderness.  No edema. Neurologic:  Normal speech and language. No gross or focal neurologic deficits are appreciated. Skin:  Skin is warm, dry and intact. No rash noted. Psychiatric: Mood and affect are normal. Speech and behavior are normal. Patient exhibits appropriate insight and judgment.   ____________________________________________  LABS (pertinent positives/negatives)  Labs Reviewed  COMPREHENSIVE METABOLIC PANEL - Abnormal; Notable for the following:       Result Value   Potassium 3.1 (*)    Glucose, Bld 112 (*)    Calcium 8.7 (*)    All other components within normal limits  CBC - Abnormal; Notable for the following:    WBC 10.9 (*)    All other components within normal limits  URINALYSIS, COMPLETE (UACMP) WITH MICROSCOPIC - Abnormal; Notable for the following:    Color, Urine YELLOW (*)    APPearance CLEAR (*)    Hgb urine dipstick SMALL (*)    Squamous Epithelial / LPF 0-5 (*)    All other components within normal limits  LIPASE, BLOOD  TROPONIN I    ____________________________________________    EKG I, Governor Rooks, MD, the attending physician have personally viewed and interpreted all  ECGs.  None  ____________________________________________  RADIOLOGY All Xrays were viewed by me. Imaging interpreted by Radiologist.  CT abd pelvis:  IMPRESSION: 1. No acute findings. No findings to account for the patient's abdominal pain. 2. Hepatic steatosis.  Right upper quadrant ultrasound:  IMPRESSION: 1. No acute findings. 2. Small gallbladder polyp. No stones or acute cholecystitis. 3. Hepatic steatosis. __________________________________________  PROCEDURES  Procedure(s) performed: None  Critical Care performed: None  ____________________________________________   ED COURSE / ASSESSMENT AND PLAN  Pertinent labs & imaging results that were available during my care of the patient were reviewed by me and considered in my medical decision making (see chart for details).   Patient's description of pain since 1 right upper quadrant, and we discussed GERD/gastritis and biliary colic. On exam however his pain is more so in the right lower quadrant. With elevated white blood cell count, I discussed obtaining abdominal CT.  CT abdomen without acute findings.  I will give  the patient a GI cocktail possible gastritis. I am going to order a right upper quadrant ultrasound.  Ultrasound overall reassuring.  With reassuring imaging, and exam improved. I am most suspicious for gastritis/GERD. I discussed with the patient. I am recommending that he follow-up with a primary care doctor as well as GI.    CONSULTATIONS:   None  Patient / Family / Caregiver informed of clinical course, medical decision-making process, and agree with plan.   I discussed return precautions, follow-up instructions, and discharge instructions with patient and/or family.  Discharge Instructions : You were evaluated for abdominal pain, and although no certain cause was found, your exam and evaluation are overall reassuring in the emergency department today. Next the next line as we discussed, I  suspect gastritis or acid reflux as possible source of ear discomfort. Try over-the-counter Pepcid, 40 mg daily for 1-2 weeks. You may try over-the-counter Tums and/or Maalox for immediate symptom relief.  Avoid fatty, spicy, soda, caffeine, fruit juices, tomatoes for 1-2 weeks.  Return to the emergency department immediately for any worsening condition including fever, vomiting blood, black or bloody stool, or worsening uncontrolled pain, or any other symptoms concerning to you.  ___________________________________________   FINAL CLINICAL IMPRESSION(S) / ED DIAGNOSES   Final diagnoses:  RUQ pain  Acute gastritis without hemorrhage, unspecified gastritis type  Gastroesophageal reflux disease, esophagitis presence not specified  Epigastric pain  Right lower quadrant abdominal pain  Right upper quadrant abdominal pain              Note: This dictation was prepared with Dragon dictation. Any transcriptional errors that result from this process are unintentional    Governor Rooks, MD 10/28/16 1523

## 2016-10-28 NOTE — ED Triage Notes (Signed)
Patient with complaint of right upper abdominal pain with nausea that started two days ago. Patient states that the pain became worse tonight.

## 2016-10-28 NOTE — Discharge Instructions (Signed)
You were evaluated for abdominal pain, and although no certain cause was found, your exam and evaluation are overall reassuring in the emergency department today. Next the next line as we discussed, I suspect gastritis or acid reflux as possible source of ear discomfort. Try over-the-counter Pepcid, 40 mg daily for 1-2 weeks. You may try over-the-counter Tums and/or Maalox for immediate symptom relief.  Avoid fatty, spicy, soda, caffeine, fruit juices, tomatoes for 1-2 weeks.  Return to the emergency department immediately for any worsening condition including fever, vomiting blood, black or bloody stool, or worsening uncontrolled pain, or any other symptoms concerning to you.

## 2016-10-28 NOTE — ED Notes (Signed)
Patient transported to Ultrasound 

## 2016-10-28 NOTE — ED Notes (Signed)
CT notified pt completed PO contrast.

## 2017-04-29 ENCOUNTER — Emergency Department
Admission: EM | Admit: 2017-04-29 | Discharge: 2017-04-29 | Disposition: A | Discharge status: Home / Self Care | Payer: Medicaid Other | Attending: Emergency Medicine | Admitting: Emergency Medicine

## 2017-04-29 ENCOUNTER — Other Ambulatory Visit: Payer: Self-pay

## 2017-04-29 ENCOUNTER — Encounter: Payer: Self-pay | Admitting: Emergency Medicine

## 2017-04-29 DIAGNOSIS — Z79899 Other long term (current) drug therapy: Secondary | ICD-10-CM | POA: Insufficient documentation

## 2017-04-29 DIAGNOSIS — Z87891 Personal history of nicotine dependence: Secondary | ICD-10-CM | POA: Insufficient documentation

## 2017-04-29 DIAGNOSIS — G8929 Other chronic pain: Secondary | ICD-10-CM | POA: Diagnosis not present

## 2017-04-29 DIAGNOSIS — M545 Low back pain, unspecified: Secondary | ICD-10-CM

## 2017-04-29 MED ORDER — HYDROCODONE-ACETAMINOPHEN 5-325 MG PO TABS: 1.0000 | ORAL_TABLET | ORAL | 0 refills | Status: DC | PRN

## 2017-04-29 MED ORDER — NAPROXEN 500 MG PO TABS: 500.0000 mg | ORAL_TABLET | Freq: Two times a day (BID) | ORAL | 0 refills | Status: DC

## 2017-04-29 MED ORDER — KETOROLAC TROMETHAMINE 30 MG/ML IJ SOLN
30.0000 mg | Freq: Once | INTRAMUSCULAR | Status: AC
  Administered 2017-04-29: 30 mg via INTRAMUSCULAR
  Filled 2017-04-29: qty 1

## 2017-04-29 MED ORDER — METHOCARBAMOL 500 MG PO TABS: ORAL_TABLET | ORAL | 0 refills | Status: DC

## 2017-04-29 MED ORDER — OXYCODONE-ACETAMINOPHEN 7.5-325 MG PO TABS
1.0000 | ORAL_TABLET | Freq: Once | ORAL | Status: AC
  Administered 2017-04-29: 1 via ORAL
  Filled 2017-04-29: qty 1

## 2017-04-29 MED ORDER — METHOCARBAMOL 500 MG PO TABS
1000.0000 mg | ORAL_TABLET | Freq: Once | ORAL | Status: AC
  Administered 2017-04-29: 1000 mg via ORAL
  Filled 2017-04-29: qty 2

## 2017-04-29 NOTE — Discharge Instructions (Signed)
Follow-up with Dr. Hyacinth MeekerMiller at [facility] if you continue to have back pain. Moist heat or ice to your back as needed for discomfort. Continue Robaxin 1 or 2 tablets every 6 hours as needed for muscle spasms. Norco every 6 hours as needed for pain.  Naproxen 500 mg twice daily with food.

## 2017-04-29 NOTE — ED Triage Notes (Signed)
Chronic Back pain lower , reoccurrence on this past Thursday, unable to sleep/rest well, left leg feels different than the other,

## 2017-04-29 NOTE — ED Notes (Signed)
See triage note  Presents with lower back pain  States he was putting his child in the truck on Friday  Twisted and felt a pop to lower back  States the pain took him to his knees  Ambulates with sl. Limp d/t increased pain  Has tried OTC med for pain w/o relief

## 2017-04-29 NOTE — ED Provider Notes (Signed)
[facility] Emergency Department Provider Note   ____________________________________________   First MD Initiated Contact with Patient 04/29/17 1216     (approximate)  I have reviewed the triage vital signs and the nursing notes.   HISTORY  Chief Complaint Back Pain  HPI Tyler PitaJames M Bernier is a 47 y.o. male is here with complaint of low back pain.  Patient states that he has a history of chronic pain.  3 days ago he lifted his 46-year-old child to put her in a booster seat inside the truck.  As he lifted her he felt a pop in his back.  He has continued to have pain in this area.  He denies any urinary symptoms, incontinence of bowel or bladder, saddle anesthesias.  Patient has been ambulatory without assistance.  He has taken over-the-counter medication without any relief.  He has been seen at [facility] for his back at which time he was given a back brace to wear.  Currently rates his pain is 6 out of 10.  History reviewed. No pertinent past medical history.  There are no active problems to display for this patient.   History reviewed. No pertinent surgical history.  Prior to Admission medications   Medication Sig Start Date End Date Taking? Authorizing Provider  acetaminophen (TYLENOL) 500 MG tablet Take 1,000 mg by mouth every 6 (six) hours as needed.    [provider]  HYDROcodone-acetaminophen (NORCO/VICODIN) 5-325 MG tablet Take 1 tablet by mouth every 4 (four) hours as needed for moderate pain. 04/29/17   Tommi RumpsSummers, Rhonda L, PA-C  methocarbamol (ROBAXIN) 500 MG tablet 1-2 tablets every 6 hours prn muscle spasms 04/29/17   Bridget HartshornSummers, Rhonda L, PA-C  naproxen (NAPROSYN) 500 MG tablet Take 1 tablet (500 mg total) by mouth 2 (two) times daily with a meal. 04/29/17   Tommi RumpsSummers, Rhonda L, PA-C    Allergies Patient has no known allergies.  No family history on file.  Social History Social History   Tobacco Use  . Smoking status: Former Games developermoker  .  Smokeless tobacco: Current User    Types: Chew  Substance Use Topics  . Alcohol use: Yes    Comment: occ  . Drug use: No    Review of Systems Constitutional: No fever/chills Cardiovascular: Denies chest pain. Respiratory: Denies shortness of breath. Gastrointestinal: No abdominal pain.  No nausea, no vomiting.  Genitourinary: Negative for dysuria. Musculoskeletal: Positive for low back pain. Skin: Negative for rash. Neurological: Negative for headaches, focal weakness or numbness. ____________________________________________   PHYSICAL EXAM:  VITAL SIGNS: ED Triage Vitals  Enc Vitals Group     BP 04/29/17 1150 113/86     Pulse Rate 04/29/17 1150 87     Resp 04/29/17 1150 18     Temp 04/29/17 1150 97.8 F (36.6 C)     Temp Source 04/29/17 1150 Oral     SpO2 04/29/17 1150 100 %     Weight 04/29/17 1151 220 lb (99.8 kg)     Height 04/29/17 1151 5\' 11"  (1.803 m)     Head Circumference --      Peak Flow --      Pain Score 04/29/17 1151 6     Pain Loc --      Pain Edu? --      Excl. in GC? --    Constitutional: Alert and oriented. Well appearing and in no acute distress. Eyes: Conjunctivae are normal.  Head: Atraumatic. Nose: No congestion/rhinnorhea. Neck: No stridor.  Cardiovascular: Normal rate, regular rhythm. Grossly normal heart sounds.  Good peripheral circulation. Respiratory: Normal respiratory effort.  No retractions. Lungs CTAB. Gastrointestinal: Soft and nontender. No distention. Musculoskeletal: There is no gross deformity noted of the lumbar spine.  Range of motion is greatly restricted secondary to muscle spasms and guarding.  No point tenderness on palpation of the lumbar spine.  Straight leg raises are limited to about 40 degrees bilaterally due to pain. Neurologic:  Normal speech and language. No gross focal neurologic deficits are appreciated.  Reflexes 1+ bilaterally.  No gait instability. Skin:  Skin is warm, dry and intact.  No ecchymosis,  abrasions, erythema present. Psychiatric: Mood and affect are normal. Speech and behavior are normal.  ____________________________________________   LABS (all labs ordered are listed, but only abnormal results are displayed)  Labs Reviewed - No data to display  PROCEDURES  Procedure(s) performed: None  Procedures  Critical Care performed: No  ____________________________________________   INITIAL IMPRESSION / ASSESSMENT AND PLAN / ED COURSE  As part of my medical decision making, I reviewed the following data within the electronic MEDICAL RECORD NUMBER Notes from prior ED visits and [place] Controlled Substance Database.  It appears that patient has not had any narcotics since 2014.  Lumbar x-rays from 2017 were reviewed.  Patient was made aware that because there was no direct trauma that we would treat for acute muscle spasms.  Patient was given Toradol 30 mg IM, Percocet, and Robaxin 1 g p.o.  At the time of discharge patient was comfortable and lying on his left side.  Patient states that he will [facility] so if any continued problems with his back.  He was discharged with a prescription for Robaxin 500 mg 1 or 2 tablets every 6 hours as needed for muscle spasms, Norco as needed for pain and naproxen 500 mg twice daily with food.   ____________________________________________   FINAL CLINICAL IMPRESSION(S) / ED DIAGNOSES  Final diagnoses:  Acute bilateral low back pain without sciatica     ED Discharge Orders        Ordered    methocarbamol (ROBAXIN) 500 MG tablet     04/29/17 1338    HYDROcodone-acetaminophen (NORCO/VICODIN) 5-325 MG tablet  Every 4 hours PRN     04/29/17 1338    naproxen (NAPROSYN) 500 MG tablet  2 times daily with meals     04/29/17 1338       Note:  This document was prepared using Dragon voice recognition software and may include unintentional dictation errors.    Tommi Rumps, PA-C 04/29/17 1343    Nita Sickle,  MD 04/30/17 [phone]

## 2017-07-03 IMAGING — CR DG KNEE COMPLETE 4+V*L*
4 series · 4 of 4 positions shown · non-contrast
Comparison: 07/31/2005 by report only

CLINICAL DATA: Left knee pain x 1 month second to twisting
incident. Feels like where femur and tib/fib meet its pulling apart.
Has had torn ligament in other knee and says this feels completely
different.

EXAM:
LEFT KNEE - COMPLETE 4+ VIEW

[knee ap]
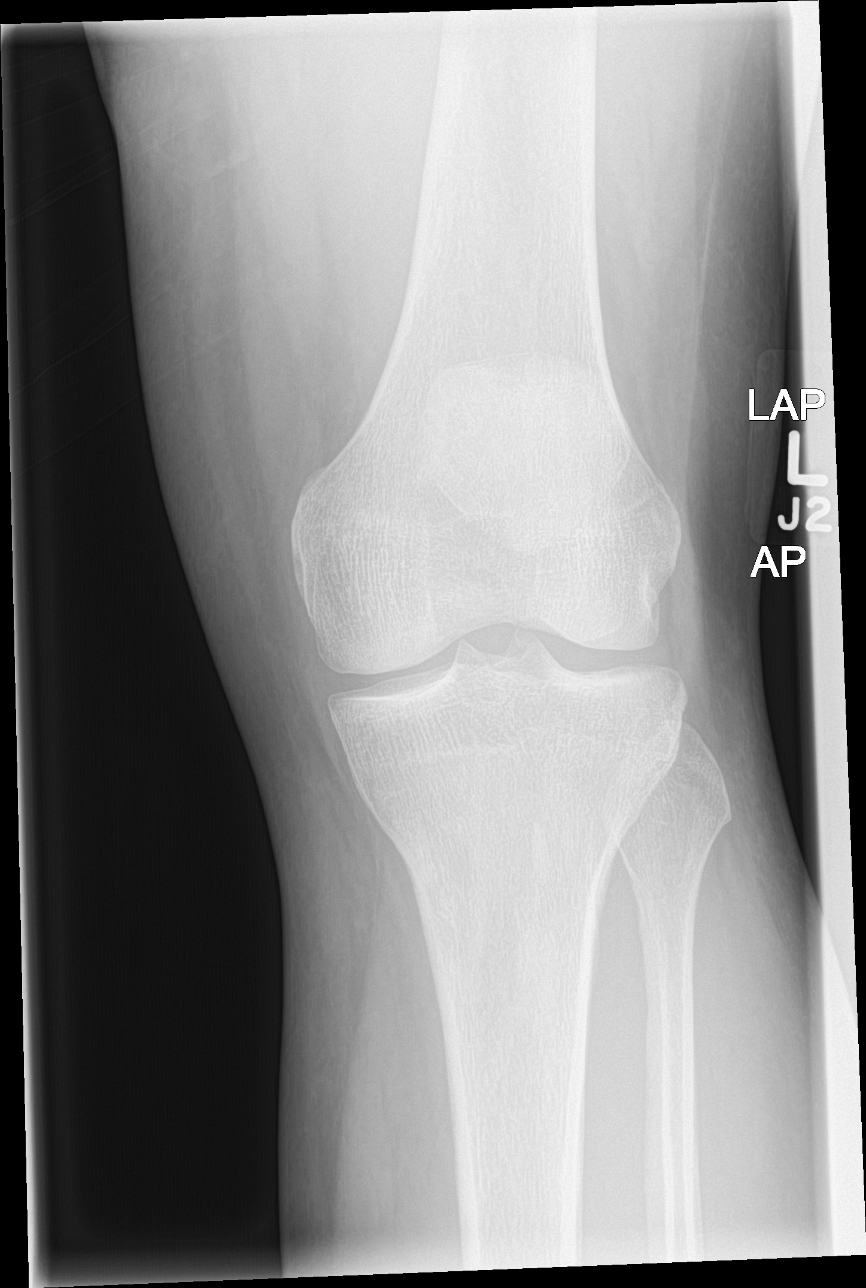

[knee obl]
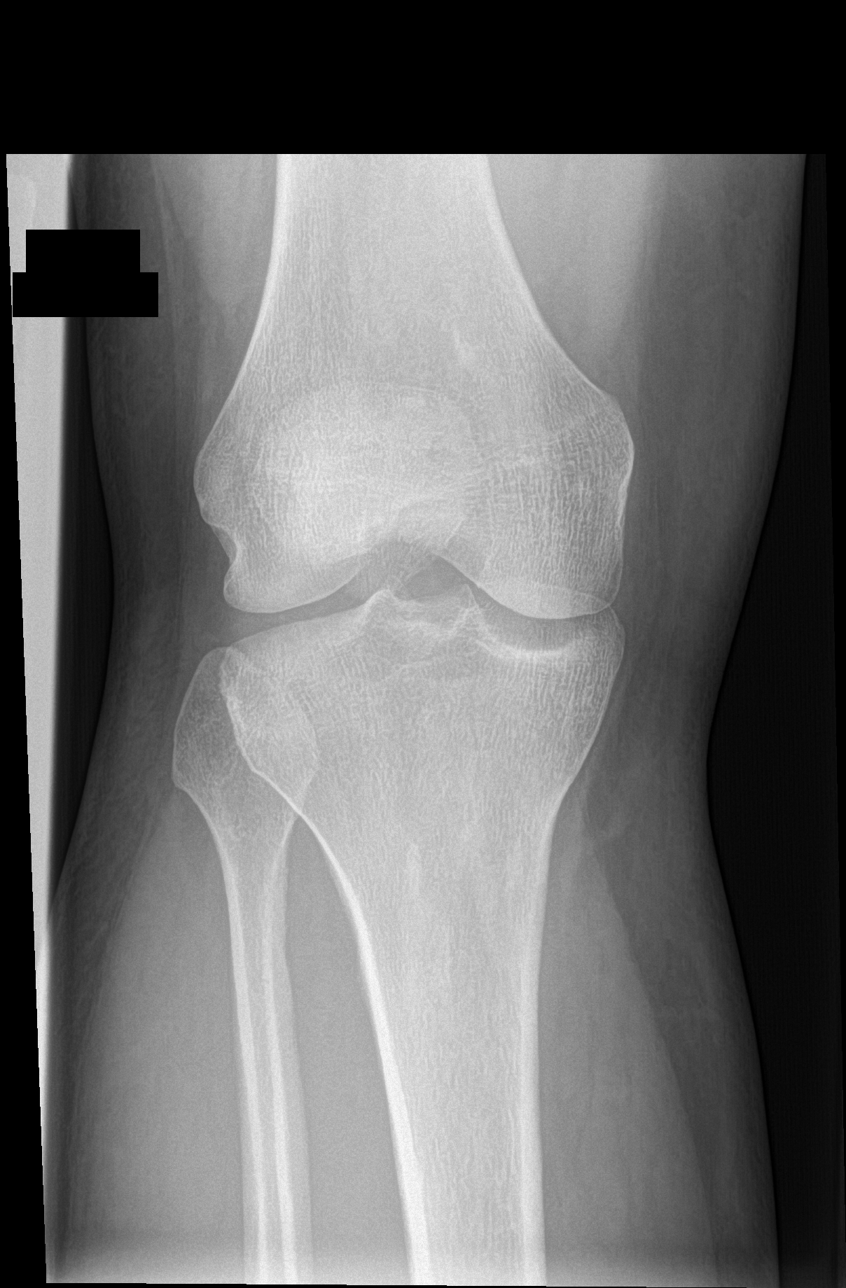

[knee lat]
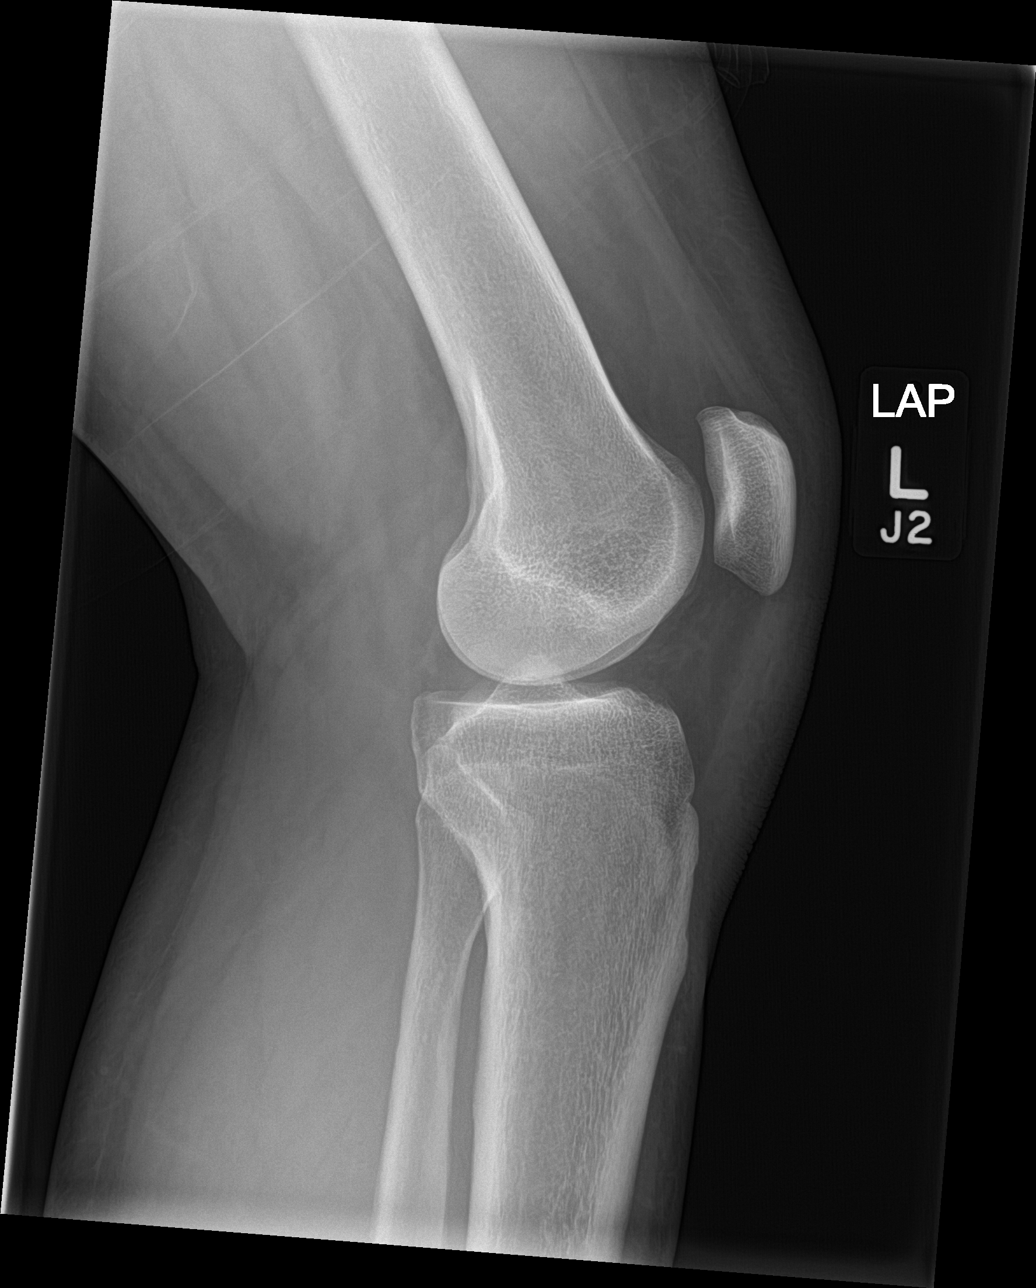

[sunrise]
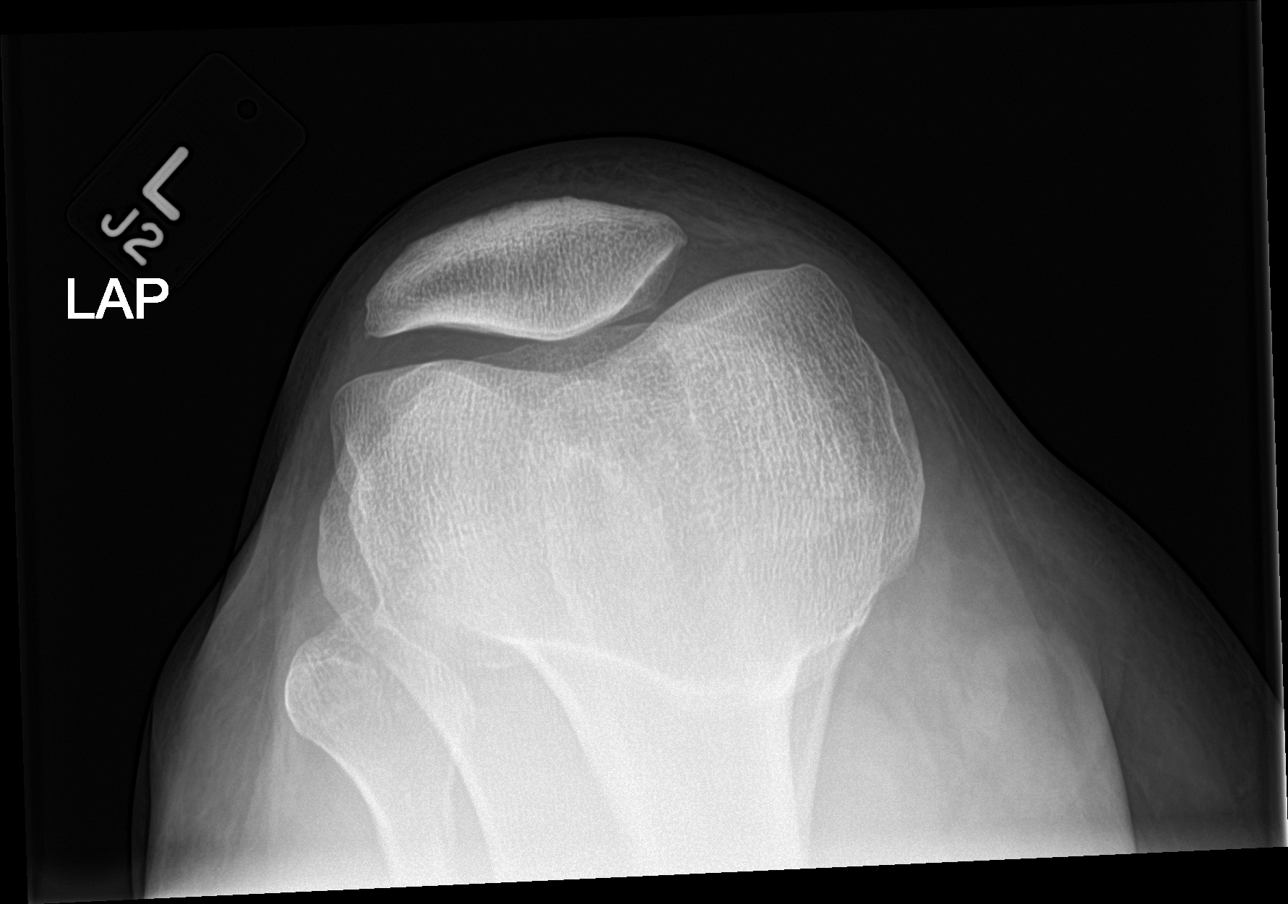

[4 of 4 positions shown; findings below may reference images not displayed]

FINDINGS: There is no evidence of fracture, dislocation, or joint effusion.
There is no evidence of arthropathy or other focal bone abnormality.
Soft tissues are unremarkable.
IMPRESSION: Negative.

## 2019-08-24 ENCOUNTER — Emergency Department
Admission: EM | Admit: 2019-08-24 | Discharge: 2019-08-24 | Disposition: A | Discharge status: Home / Self Care | Payer: Medicaid Other | Attending: Student | Admitting: Student

## 2019-08-24 ENCOUNTER — Emergency Department: Payer: Medicaid Other

## 2019-08-24 ENCOUNTER — Other Ambulatory Visit: Payer: Self-pay

## 2019-08-24 DIAGNOSIS — F17220 Nicotine dependence, chewing tobacco, uncomplicated: Secondary | ICD-10-CM | POA: Diagnosis not present

## 2019-08-24 DIAGNOSIS — M545 Low back pain, unspecified: Secondary | ICD-10-CM

## 2019-08-24 DIAGNOSIS — X500XXA Overexertion from strenuous movement or load, initial encounter: Secondary | ICD-10-CM | POA: Insufficient documentation

## 2019-08-24 MED ORDER — LIDOCAINE 5 % EX PTCH
1.0000 | MEDICATED_PATCH | CUTANEOUS | Status: DC
  Administered 2019-08-24: 1 via TRANSDERMAL
  Filled 2019-08-24: qty 1

## 2019-08-24 MED ORDER — CYCLOBENZAPRINE HCL 5 MG PO TABS: ORAL_TABLET | ORAL | 0 refills | Status: DC

## 2019-08-24 MED ORDER — OXYCODONE-ACETAMINOPHEN 5-325 MG PO TABS
1.0000 | ORAL_TABLET | Freq: Once | ORAL | Status: AC
  Administered 2019-08-24: 1 via ORAL
  Filled 2019-08-24: qty 1

## 2019-08-24 MED ORDER — ORPHENADRINE CITRATE 30 MG/ML IJ SOLN
60.0000 mg | Freq: Two times a day (BID) | INTRAMUSCULAR | Status: DC
  Administered 2019-08-24: 60 mg via INTRAMUSCULAR
  Filled 2019-08-24: qty 2

## 2019-08-24 MED ORDER — HYDROCODONE-ACETAMINOPHEN 5-325 MG PO TABS: 1.0000 | ORAL_TABLET | Freq: Three times a day (TID) | ORAL | 0 refills | Status: AC | PRN

## 2019-08-24 MED ORDER — KETOROLAC TROMETHAMINE 30 MG/ML IJ SOLN
30.0000 mg | Freq: Once | INTRAMUSCULAR | Status: AC
  Administered 2019-08-24: 30 mg via INTRAMUSCULAR
  Filled 2019-08-24: qty 1

## 2019-08-24 MED ORDER — KETOROLAC TROMETHAMINE 10 MG PO TABS: 10.0000 mg | ORAL_TABLET | Freq: Four times a day (QID) | ORAL | 0 refills | Status: DC | PRN

## 2019-08-24 MED ORDER — LIDOCAINE 5 % EX PTCH: 1.0000 | MEDICATED_PATCH | CUTANEOUS | 0 refills | Status: DC

## 2019-08-24 NOTE — ED Notes (Signed)
[phone] number of wife, wants to be contacted when patient placed in room.

## 2019-08-24 NOTE — ED Provider Notes (Signed)
[facility] Emergency Department Provider Note  ____________________________________________  Time seen: Approximately 5:58 PM  I have reviewed the triage vital signs and the nursing notes.   HISTORY  Chief Complaint Back Pain    HPI Mike Herrera is a 49 y.o. male that presents to emergency department for evaluation of right lower back pain today.  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  He did not have any pain or injury while he was loading refrigerator.  This morning he went to load a mattress for work when he felt sharp pain to his low back.  He did not feel a pop.  No bowel or bladder dysfunction or saddle anesthesias.  He has had pain with walking.  No alleviating measures have been attempted.  No history of back problems.     History reviewed. No pertinent past medical history.  There are no problems to display for this patient.   History reviewed. No pertinent surgical history.  Prior to Admission medications   Medication Sig Start Date End Date Taking? Authorizing Provider  acetaminophen (TYLENOL) 500 MG tablet Take 1,000 mg by mouth every 6 (six) hours as needed.    [provider]  cyclobenzaprine (FLEXERIL) 5 MG tablet Take 1-2 tablets 3 times daily as needed 08/24/19   Enid Derry, PA-C  HYDROcodone-acetaminophen (NORCO/VICODIN) 5-325 MG tablet Take 1 tablet by mouth every 8 (eight) hours as needed for up to 2 days for moderate pain. 08/24/19 08/26/19  Enid Derry, PA-C  ketorolac (TORADOL) 10 MG tablet Take 1 tablet (10 mg total) by mouth every 6 (six) hours as needed. 08/24/19   Enid Derry, PA-C  lidocaine (LIDODERM) 5 % Place 1 patch onto the skin daily. Remove & Discard patch within 12 hours or as directed by MD 08/24/19   Enid Derry, PA-C    Allergies Patient has no known allergies.  No family history on file.  Social History Social History   Tobacco Use  . Smoking status: Former Games developer   . Smokeless tobacco: Current User    Types: Chew  Substance Use Topics  . Alcohol use: Yes    Comment: occ  . Drug use: No     Review of Systems  Cardiovascular: No chest pain. Respiratory: No SOB. Gastrointestinal: No abdominal pain.  No nausea, no vomiting.  Musculoskeletal: Positive for low back pain. Skin: Negative for rash, abrasions, lacerations, ecchymosis. Neurological: Negative for headaches, numbness or tingling   ____________________________________________   PHYSICAL EXAM:  VITAL SIGNS: ED Triage Vitals  Enc Vitals Group     BP 08/24/19 1641 109/66     Pulse Rate 08/24/19 1641 90     Resp 08/24/19 1641 20     Temp 08/24/19 1641 99.2 F (37.3 C)     Temp Source 08/24/19 1641 Oral     SpO2 08/24/19 1641 96 %     Weight 08/24/19 1642 240 lb (108.9 kg)     Height 08/24/19 1642 5\' 9"  (1.753 m)     Head Circumference --      Peak Flow --      Pain Score 08/24/19 1646 10     Pain Loc --      Pain Edu? --      Excl. in GC? --      Constitutional: Alert and oriented. Well appearing and in no acute distress. Eyes: Conjunctivae are normal. PERRL. EOMI. Head: Atraumatic. ENT:      Ears:  Nose: No congestion/rhinnorhea.      Mouth/Throat: Mucous membranes are moist.  Neck: No stridor.  Cardiovascular: Normal rate, regular rhythm.  Good peripheral circulation. Respiratory: Normal respiratory effort without tachypnea or retractions. Lungs CTAB. Good air entry to the bases with no decreased or absent breath sounds. Gastrointestinal: Bowel sounds 4 quadrants. Soft and nontender to palpation. No guarding or rigidity. No palpable masses. No distention.. Musculoskeletal: Full range of motion to all extremities. No gross deformities appreciated.  Mild tenderness to palpation to superior lumbar spine and right lumbar paraspinal muscles. Strength equal in lower extremities bilaterally. Full ROM of toes. Antalgic gait. Neurologic:  Normal speech and language. No  gross focal neurologic deficits are appreciated.  Skin:  Skin is warm, dry and intact. No rash noted. Psychiatric: Mood and affect are normal. Speech and behavior are normal. Patient exhibits appropriate insight and judgement.   ____________________________________________   LABS (all labs ordered are listed, but only abnormal results are displayed)  Labs Reviewed - No data to display ____________________________________________  EKG   ____________________________________________  RADIOLOGY Lexine Baton, personally viewed and evaluated these images (plain radiographs) as part of my medical decision making, as well as reviewing the written report by the radiologist.  DG Lumbar Spine 2-3 Views  Result Date: 08/24/2019 CLINICAL DATA:  Lumbosacral back pain. EXAM: LUMBAR SPINE - 2-3 VIEW COMPARISON:  Radiograph 12/24/2015 FINDINGS: The alignment is maintained. Vertebral body heights are normal. There is no listhesis. The posterior elements are intact. Similar mild disc space narrowing and endplate spurring at L1-L2. Minimal L5-S1 disc space narrowing. No fracture or evidence of focal lesion. Sacroiliac joints are symmetric and normal. IMPRESSION: Mild degenerative disc disease at L1-L2 and L5-S1, no significant change from 2017 exam. Electronically Signed   By: Narda Rutherford M.D.   On: 08/24/2019 17:50    ____________________________________________    PROCEDURES  Procedure(s) performed:    Procedures    Medications  orphenadrine (NORFLEX) injection 60 mg (60 mg Intramuscular Given 08/24/19 1742)  lidocaine (LIDODERM) 5 % 1 patch (1 patch Transdermal Patch Applied 08/24/19 1741)  oxyCODONE-acetaminophen (PERCOCET/ROXICET) 5-325 MG per tablet 1 tablet (1 tablet Oral Given 08/24/19 1741)  ketorolac (TORADOL) 30 MG/ML injection 30 mg (30 mg Intramuscular Given 08/24/19 1801)     ____________________________________________   INITIAL IMPRESSION / ASSESSMENT AND PLAN / ED  COURSE  Pertinent labs & imaging results that were available during my care of the patient were reviewed by me and considered in my medical decision making (see chart for details).  Review of the [place] CSRS was performed in accordance of the [facility] prior to dispensing any controlled drugs.    Patient presented to emergency department for evaluation of low back pain.  Vital signs and exam are reassuring.  Lumbar x-ray negative for acute bony abnormalities.  Patient will be discharged home with prescriptions for Lidoderm, Flexeril, Toradol, a short course of Vicodin.  Patient is to follow up with primary care as directed. Patient is given ED precautions to return to the ED for any worsening or new symptoms.   Mike Herrera was evaluated in Emergency Department on 08/24/2019 for the symptoms described in the history of present illness. He was evaluated in the context of the global COVID-19 pandemic, which necessitated consideration that the patient might be at risk for infection with the SARS-CoV-2 virus that causes COVID-19. Institutional protocols and algorithms that pertain to the evaluation of patients at risk for COVID-19 are in a state of rapid change  based on information released by regulatory bodies including the CDC and federal and state organizations. These policies and algorithms were followed during the patient's care in the ED.   ____________________________________________  FINAL CLINICAL IMPRESSION(S) / ED DIAGNOSES  Final diagnoses:  Acute midline low back pain without sciatica      NEW MEDICATIONS STARTED DURING THIS VISIT:  ED Discharge Orders         Ordered    ketorolac (TORADOL) 10 MG tablet  Every 6 hours PRN     Discontinue  Reprint     08/24/19 1822    HYDROcodone-acetaminophen (NORCO/VICODIN) 5-325 MG tablet  Every 8 hours PRN     Discontinue  Reprint     08/24/19 1822    cyclobenzaprine (FLEXERIL) 5 MG tablet     Discontinue  Reprint     08/24/19 1822    lidocaine  (LIDODERM) 5 %  Every 24 hours     Discontinue  Reprint     08/24/19 1822              This chart was dictated using voice recognition software/Dragon. Despite best efforts to proofread, errors can occur which can change the meaning. Any change was purely unintentional.    Laban Emperor, PA-C 08/24/19 1859    Lilia Pro., MD 08/25/19 [phone]

## 2019-08-24 NOTE — ED Triage Notes (Signed)
Pt comes via POV from home with c/o lower back pain. 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 Pt states pain to stand and even ambulate.

## 2020-01-28 ENCOUNTER — Other Ambulatory Visit: Payer: Self-pay

## 2020-01-28 ENCOUNTER — Emergency Department: Payer: Medicaid Other

## 2020-01-28 ENCOUNTER — Emergency Department
Admission: EM | Admit: 2020-01-28 | Discharge: 2020-01-28 | Disposition: A | Discharge status: Home / Self Care | Payer: Medicaid Other | Attending: Emergency Medicine | Admitting: Emergency Medicine

## 2020-01-28 DIAGNOSIS — Z5321 Procedure and treatment not carried out due to patient leaving prior to being seen by health care provider: Secondary | ICD-10-CM | POA: Insufficient documentation

## 2020-01-28 DIAGNOSIS — R0981 Nasal congestion: Secondary | ICD-10-CM | POA: Insufficient documentation

## 2020-01-28 DIAGNOSIS — R059 Cough, unspecified: Secondary | ICD-10-CM | POA: Diagnosis present

## 2020-01-28 NOTE — ED Notes (Signed)
Pt presents to ED with c/o intermittent cough and sinus congestion for the last few days. Pt denies fevers, chills, N/V, SOB, CP. Pt ambulatory on arrival and appears to be in NAD at this time.

## 2020-01-28 NOTE — ED Notes (Signed)
This RN and Hospital doctor at bedside. Pt refusing COVID swab. PA made aware

## 2020-01-28 NOTE — ED Triage Notes (Signed)
Pt comes via POV from home with c/o 4 day nasal congestion and cough. Pt states it is worse at night. Pt denies any fever or other symptoms.

## 2020-01-28 NOTE — ED Notes (Signed)
Pt left without receiving discharge papers or making staff aware. Unable to obtain electronic signature or vital signs at this time

## 2020-01-28 NOTE — ED Triage Notes (Signed)
First RN note: Pt to ED via POV with c/o cough with intermittent clear and intermittent green sputum, pt also c/o nasal congestion at this time.

## 2020-07-18 ENCOUNTER — Emergency Department
Admission: EM | Admit: 2020-07-18 | Discharge: 2020-07-18 | Disposition: A | Discharge status: Home / Self Care | Payer: Medicaid Other | Attending: Emergency Medicine | Admitting: Emergency Medicine

## 2020-07-18 ENCOUNTER — Emergency Department: Payer: Medicaid Other

## 2020-07-18 ENCOUNTER — Other Ambulatory Visit: Payer: Self-pay

## 2020-07-18 DIAGNOSIS — M5432 Sciatica, left side: Secondary | ICD-10-CM

## 2020-07-18 DIAGNOSIS — Z87891 Personal history of nicotine dependence: Secondary | ICD-10-CM | POA: Diagnosis not present

## 2020-07-18 DIAGNOSIS — M5442 Lumbago with sciatica, left side: Secondary | ICD-10-CM | POA: Diagnosis not present

## 2020-07-18 DIAGNOSIS — M545 Low back pain, unspecified: Secondary | ICD-10-CM | POA: Diagnosis present

## 2020-07-18 DIAGNOSIS — M4807 Spinal stenosis, lumbosacral region: Secondary | ICD-10-CM

## 2020-07-18 MED ORDER — CYCLOBENZAPRINE HCL 10 MG PO TABS: 10.0000 mg | ORAL_TABLET | Freq: Three times a day (TID) | ORAL | 0 refills | Status: DC | PRN

## 2020-07-18 MED ORDER — PREDNISONE 10 MG (21) PO TBPK: ORAL_TABLET | ORAL | 0 refills | Status: DC

## 2020-07-18 MED ORDER — OXYCODONE-ACETAMINOPHEN 5-325 MG PO TABS: 1.0000 | ORAL_TABLET | Freq: Four times a day (QID) | ORAL | 0 refills | Status: AC | PRN

## 2020-07-18 MED ORDER — PREDNISONE 20 MG PO TABS
60.0000 mg | ORAL_TABLET | Freq: Once | ORAL | Status: AC
  Administered 2020-07-18: 60 mg via ORAL
  Filled 2020-07-18: qty 3

## 2020-07-18 NOTE — ED Notes (Signed)
See triage note  Presents with lower back pain which is moving into left leg   States pain started about 2 weeks ago  Denies any recent injury  Ambulates with slight limp   Also having pain to right hand for couple of days  Unsure of injury but state he did work on his son's car over the weekend

## 2020-07-18 NOTE — Discharge Instructions (Signed)
Please call and schedule an appointment with orthopedics.  Take the steroid until finished. Do not take the muscle relaxer or pain medication if you plan to work or drive.  Return to the ER if your symptoms worsen and you are unable to see primary care or the orthopedic specialist.

## 2020-07-18 NOTE — ED Triage Notes (Signed)
Pt states that he has been having left lower back pain that radiates into his left leg, states his foot doesn't lift when he walks as well, pt is also c/o right hand pain states that he may have over worked it

## 2020-07-18 NOTE — ED Provider Notes (Signed)
[facility] Emergency Department Provider Note ____________________________________________  Time seen: Approximately 8:56 AM  I have reviewed the triage vital signs and the nursing notes.  HISTORY  Chief Complaint Hand Pain and Back Pain   HPI Mike Herrera is a 50 y.o. male presenting to the emergency department for treatment and evaluation of left lower back pain that radiates into his left leg.  He states that it has progressively worsened to the point where his foot does not lift completely off the ground when he walks.  Symptoms have been present for the past 2 weeks.  He states that he hurt his back while working at [employer].  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  He is not necessarily here for evaluation of his right hand he does happen to mention it when in triage.  He states that he has been working on his son's car and feels that has been overused.  He denies specific injury to that hand.  No alleviating measures attempted prior to arrival.  No past medical history on file.  There are no problems to display for this patient.   No past surgical history on file.  Prior to Admission medications   Medication Sig Start Date End Date Taking? Authorizing Provider  cyclobenzaprine (FLEXERIL) 10 MG tablet Take 1 tablet (10 mg total) by mouth 3 (three) times daily as needed. 07/18/20  Yes Charlotte Brafford B, FNP  oxyCODONE-acetaminophen (PERCOCET) 5-325 MG tablet Take 1 tablet by mouth every 6 (six) hours as needed. 07/18/20 07/18/21 Yes Antonisha Waskey B, FNP  predniSONE (STERAPRED UNI-PAK 21 TAB) 10 MG (21) TBPK tablet Take 6 tablets on the first day and decrease by 1 tablet each day until finished. 07/18/20  Yes Jere Bostrom B, FNP  acetaminophen (TYLENOL) 500 MG tablet Take 1,000 mg by mouth every 6 (six) hours as needed.    [provider]    Allergies Patient has no known allergies.  No family history on file.  Social  History Social History   Tobacco Use  . Smoking status: Former Games developer  . Smokeless tobacco: Current User    Types: Chew  Substance Use Topics  . Alcohol use: Yes    Comment: occ  . Drug use: No    Review of Systems Constitutional: Well appearing. Respiratory: Negative for dyspnea. Cardiovascular: Negative for change in skin temperature or color. Musculoskeletal:   Negative for chronic steroid use   Negative for trauma in the presence of osteoporosis  Negative for age over 75 and trauma.  Negative for constitutional symptoms, or history of cancer   Negative for pain worse at night. Skin: Negative for rash, lesion, or wound.  Genitourinary: Negative for urinary retention. Rectal: Negative for fecal incontinence or new onset constipation/bowel habit changes. Hematological/Immunilogical: Negative for immunosuppression, IV drug use, or fever Neurological: Positive for burning, tingling, numb, electric, radiating pain in the left lower extremity.                        Negative for saddle anesthesia.                        Negative for focal neurologic deficit, progressive or disabling symptoms             Negative for saddle anesthesia. ____________________________________________   PHYSICAL EXAM:  VITAL SIGNS: ED Triage Vitals  Enc Vitals Group  BP 07/18/20 0806 126/84     Pulse Rate 07/18/20 0806 74     Resp 07/18/20 0806 16     Temp 07/18/20 0806 97.9 F (36.6 C)     Temp Source 07/18/20 0806 Oral     SpO2 07/18/20 0806 98 %     Weight 07/18/20 0807 260 lb (117.9 kg)     Height 07/18/20 0807 5\' 9"  (1.753 m)     Head Circumference --      Peak Flow --      Pain Score 07/18/20 0807 8     Pain Loc --      Pain Edu? --      Excl. in GC? --     Constitutional: Alert and oriented. Well appearing and in no acute distress. Eyes: Conjunctivae are clear without discharge or drainage.  Head: Atraumatic. Neck: Full, active range of motion. Respiratory: Respirations  even and unlabored. Musculoskeletal: Decreased ROM of the back and left lower extremity, Strength 5/5 of the lower extremities as tested. Neurologic: Reflexes of the lower extremities are 2+. Positive straight leg raise on the left side. Skin: Atraumatic.  Psychiatric: Behavior and affect are normal.  ____________________________________________   LABS (all labs ordered are listed, but only abnormal results are displayed)  Labs Reviewed - No data to display ____________________________________________  RADIOLOGY  MR  ____________________________________________   PROCEDURES  Procedure(s) performed:  Procedures ____________________________________________   INITIAL IMPRESSION / ASSESSMENT AND PLAN / ED COURSE  Mike Herrera is a 50 y.o. male presenting to the emergency department for treatment and evaluation of left lower back pain that radiates into the left leg.  Patient states that he has had some chronic back pain in the past but this is different.  See HPI for further details.  On exam, he does have a positive straight leg raise on the left.  He has some left lower leg weakness on flexion but otherwise strength is 5 out of 5 throughout.  Because he has some degree of weakness on the left and he reports that he is "dragging" his left foot when he walks, MRI ordered. Plan discussed with patient who is agreeable.  Medications  predniSONE (DELTASONE) tablet 60 mg (60 mg Oral Given 07/18/20 0933)    ED Discharge Orders         Ordered    oxyCODONE-acetaminophen (PERCOCET) 5-325 MG tablet  Every 6 hours PRN        07/18/20 1024    predniSONE (STERAPRED UNI-PAK 21 TAB) 10 MG (21) TBPK tablet        07/18/20 1024    cyclobenzaprine (FLEXERIL) 10 MG tablet  3 times daily PRN        07/18/20 1024           Pertinent labs & imaging results that were available during my care of the patient were reviewed by me and considered in my medical decision making (see chart for  details).  _________________________________________   FINAL CLINICAL IMPRESSION(S) / ED DIAGNOSES  Final diagnoses:  Spinal stenosis of lumbosacral region  Sciatica of left side     If controlled substance prescribed during this visit, 12 month history viewed on the [place] prior to issuing an initial prescription for Schedule II or III opiod.   09/17/20, FNP 07/18/20 1539    09/17/20, MD 07/19/20 1325

## 2020-07-26 DIAGNOSIS — M5416 Radiculopathy, lumbar region: Secondary | ICD-10-CM | POA: Diagnosis not present

## 2021-08-22 ENCOUNTER — Emergency Department: Payer: Medicaid Other

## 2021-08-22 ENCOUNTER — Encounter: Payer: Self-pay | Admitting: Intensive Care

## 2021-08-22 ENCOUNTER — Other Ambulatory Visit: Payer: Self-pay

## 2021-08-22 ENCOUNTER — Emergency Department
Admission: EM | Admit: 2021-08-22 | Discharge: 2021-08-22 | Disposition: A | Discharge status: Home / Self Care | Payer: Medicaid Other | Attending: Student in an Organized Health Care Education/Training Program | Admitting: Student in an Organized Health Care Education/Training Program

## 2021-08-22 DIAGNOSIS — S53401A Unspecified sprain of right elbow, initial encounter: Secondary | ICD-10-CM | POA: Insufficient documentation

## 2021-08-22 DIAGNOSIS — S46911A Strain of unspecified muscle, fascia and tendon at shoulder and upper arm level, right arm, initial encounter: Secondary | ICD-10-CM | POA: Diagnosis not present

## 2021-08-22 DIAGNOSIS — M25521 Pain in right elbow: Secondary | ICD-10-CM | POA: Diagnosis present

## 2021-08-22 DIAGNOSIS — W010XXA Fall on same level from slipping, tripping and stumbling without subsequent striking against object, initial encounter: Secondary | ICD-10-CM | POA: Insufficient documentation

## 2021-08-22 DIAGNOSIS — S59901A Unspecified injury of right elbow, initial encounter: Secondary | ICD-10-CM | POA: Diagnosis not present

## 2021-08-22 DIAGNOSIS — Y990 Civilian activity done for income or pay: Secondary | ICD-10-CM | POA: Diagnosis not present

## 2021-08-22 MED ORDER — MELOXICAM 15 MG PO TABS: 15.0000 mg | ORAL_TABLET | Freq: Every day | ORAL | 0 refills | Status: DC

## 2021-08-22 NOTE — ED Triage Notes (Signed)
Patient reports falling at work yesterday and hurting his right shoulder and elbow. Also reports lower back pain. Ambulatory with NAD noted

## 2021-08-22 NOTE — ED Provider Notes (Signed)
[facility] Provider Note    Event Date/Time   First MD Initiated Contact with Patient 08/22/21 0750     (approximate)   History   Fall   HPI  Mike Herrera is a 51 y.o. male presents to the emergency department for treatment and evaluation of right elbow pain after injury at work yesterday.  Patient states that he tripped and was falling forward to grab hold of the vice to prevent his fall.  He felt something pop in his right elbow.  Pain increases with extension of the elbow and fingers.  He also has some tenderness in the mid upper arm but feels that this is just a muscle strain.  He also feels like he strained his back during this trip episode.  No alleviating measures attempted prior to arrival.   History reviewed. No pertinent past medical history.   Physical Exam   Triage Vital Signs: ED Triage Vitals [08/22/21 0732]  Enc Vitals Group     BP 127/86     Pulse Rate 74     Resp 16     Temp 98.4 F (36.9 C)     Temp Source Oral     SpO2 96 %     Weight 260 lb (117.9 kg)     Height 6\' 2"  (1.88 m)     Head Circumference      Peak Flow      Pain Score 6     Pain Loc      Pain Edu?      Excl. in GC?     Most recent vital signs: Vitals:   08/22/21 0732  BP: 127/86  Pulse: 74  Resp: 16  Temp: 98.4 F (36.9 C)  SpO2: 96%    General: Awake, no distress.  CV:  Good peripheral perfusion.  Resp:  Normal effort.  Abd:  No distention.  Other:  Focal tenderness over the lateral aspect of the right elbow overlying the lateral epicondyle.   ED Results / Procedures / Treatments   Labs (all labs ordered are listed, but only abnormal results are displayed) Labs Reviewed - No data to display   EKG  Not indicated.   RADIOLOGY  Image of the right elbow negative for acute bony abnormality.  I have independently reviewed and interpreted imaging as well as reviewed report from radiology.  PROCEDURES:  Critical Care performed:  No  Procedures   MEDICATIONS ORDERED IN ED:  Medications - No data to display   IMPRESSION / MDM / ASSESSMENT AND PLAN / ED COURSE   I reviewed the triage vital signs and the nursing notes.  Differential diagnosis includes, but is not limited to: Elbow strain, avulsion fracture, radial head fracture  Patient's presentation is most consistent with acute injury requiring diagnostic work-up.  51 year old male presenting to the emergency department for treatment and evaluation after injuring himself at work yesterday.  See HPI for further details.  Plan will be to get imaging of the elbow.  X-rays negative for acute concerns.  Results discussed with the patient.  Sling will be applied for him to wear off-and-on but he was encouraged not to use it consistently during the day and not to use it for more than a week.  He was also advised to use ice off-and-on throughout the day.  He is to follow-up with his primary care provider if not improving over the week.  He will return to the emergency department for symptoms of change  or worsen if unable to schedule an appointment.     FINAL CLINICAL IMPRESSION(S) / ED DIAGNOSES   Final diagnoses:  Elbow strain, right, initial encounter     Rx / DC Orders   ED Discharge Orders          Ordered    meloxicam (MOBIC) 15 MG tablet  Daily        08/22/21 0848             Note:  This document was prepared using Dragon voice recognition software and may include unintentional dictation errors.   Chinita Pester, FNP 08/22/21 [id number]    Willy Eddy, MD 08/22/21 [phone]

## 2021-08-22 NOTE — ED Notes (Signed)
37 yom with a c/c of right sided elbow, shoulder and lower back pain. The pt advised he had stepped on something yesterday at work causing him to grab a vice on a table when he heard a pop in his elbow. The pt advised no workers comp.

## 2021-08-22 NOTE — Discharge Instructions (Signed)
Follow-up with your primary care provider if you are not improving over the week. Take the meloxicam daily as prescribed. Return to the emergency department for symptoms that change or worsen if you are unable to schedule an appointment with primary care.

## 2022-09-11 ENCOUNTER — Other Ambulatory Visit: Payer: Self-pay

## 2022-09-11 ENCOUNTER — Emergency Department
Admission: EM | Admit: 2022-09-11 | Discharge: 2022-09-11 | Disposition: A | Discharge status: Home / Self Care | Payer: Medicaid Other | Attending: Emergency Medicine | Admitting: Emergency Medicine

## 2022-09-11 DIAGNOSIS — S39012A Strain of muscle, fascia and tendon of lower back, initial encounter: Secondary | ICD-10-CM | POA: Insufficient documentation

## 2022-09-11 DIAGNOSIS — X58XXXA Exposure to other specified factors, initial encounter: Secondary | ICD-10-CM | POA: Diagnosis not present

## 2022-09-11 DIAGNOSIS — M545 Low back pain, unspecified: Secondary | ICD-10-CM | POA: Diagnosis present

## 2022-09-11 LAB — URINALYSIS, ROUTINE W REFLEX MICROSCOPIC
Bilirubin Urine: NEGATIVE
Glucose, UA: NEGATIVE mg/dL
Ketones, ur: NEGATIVE mg/dL
Leukocytes,Ua: NEGATIVE
Nitrite: NEGATIVE
Protein, ur: NEGATIVE mg/dL
Specific Gravity, Urine: 1.027 (ref 1.005–1.030)
pH: 5 (ref 5.0–8.0)

## 2022-09-11 MED ORDER — METHOCARBAMOL 500 MG PO TABS
1000.0000 mg | ORAL_TABLET | Freq: Once | ORAL | Status: AC
  Administered 2022-09-11: 1000 mg via ORAL
  Filled 2022-09-11: qty 2

## 2022-09-11 MED ORDER — HYDROCODONE-ACETAMINOPHEN 5-325 MG PO TABS
1.0000 | ORAL_TABLET | Freq: Once | ORAL | Status: AC
  Administered 2022-09-11: 1 via ORAL
  Filled 2022-09-11: qty 1

## 2022-09-11 MED ORDER — NAPROXEN 500 MG PO TABS: 500.0000 mg | ORAL_TABLET | Freq: Two times a day (BID) | ORAL | 0 refills | Status: DC

## 2022-09-11 MED ORDER — METHOCARBAMOL 500 MG PO TABS: ORAL_TABLET | ORAL | 0 refills | Status: DC

## 2022-09-11 MED ORDER — HYDROCODONE-ACETAMINOPHEN 5-325 MG PO TABS: 1.0000 | ORAL_TABLET | Freq: Four times a day (QID) | ORAL | 0 refills | Status: DC | PRN

## 2022-09-11 NOTE — ED Provider Notes (Signed)
[facility] Provider Note    Event Date/Time   First MD Initiated Contact with Patient 09/11/22 1206     (approximate)   History   Back Pain   HPI  Mike Herrera is a 52 y.o. male   presents to the ED with complaint of right-sided low back pain for approximately 1 week without known history of injury.  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  Patient has been taking ibuprofen and took 2 hydrocodone that belonged to a friend without any relief of his pain.  He denies any urinary symptoms or previous stones.  He denies any nausea, vomiting.  Occasionally he does feel some minimal radiation of his pain from his back into the upper part of his right lower extremity but continues to ambulate without any assistance.       Physical Exam   Triage Vital Signs: ED Triage Vitals  Enc Vitals Group     BP 09/11/22 1141 (!) 138/97     Pulse Rate 09/11/22 1141 66     Resp 09/11/22 1141 17     Temp 09/11/22 1141 98 F (36.7 C)     Temp Source 09/11/22 1141 Oral     SpO2 09/11/22 1141 97 %     Weight 09/11/22 1142 257 lb 15 oz (117 kg)     Height 09/11/22 1142 6' (1.829 m)     Head Circumference --      Peak Flow --      Pain Score 09/11/22 1142 8     Pain Loc --      Pain Edu? --      Excl. in GC? --     Most recent vital signs: Vitals:   09/11/22 1141 09/11/22 1411  BP: (!) 138/97 133/69  Pulse: 66 71  Resp: 17 18  Temp: 98 F (36.7 C) 97.9 F (36.6 C)  SpO2: 97% 100%     General: Awake, no distress.  CV:  Good peripheral perfusion.  Heart regular rate and rhythm. Resp:  Normal effort.  Clear bilaterally. Abd:  No distention.  Soft, minimal tenderness on palpation of the right lateral aspect that is generalized.  No point tenderness or rebound tenderness noted right lower quadrant.  Bowel sounds are normoactive x 4 quadrants. Other: On examination of the back there is no gross deformity  however patient does have some tenderness on palpation of the right paravertebral muscles at L5-S1 area and right SI joint.  Patient is able to get from a supine position to sitting without any assistance and is ambulatory.  Good muscle strength bilaterally at 5/5.    ED Results / Procedures / Treatments   Labs (all labs ordered are listed, but only abnormal results are displayed) Labs Reviewed  URINALYSIS, ROUTINE W REFLEX MICROSCOPIC - Abnormal; Notable for the following components:      Result Value   Color, Urine YELLOW (*)    APPearance CLEAR (*)    Hgb urine dipstick MODERATE (*)    Bacteria, UA RARE (*)    All other components within normal limits      PROCEDURES:  Critical Care performed:   Procedures   MEDICATIONS ORDERED IN ED: Medications  HYDROcodone-acetaminophen (NORCO/VICODIN) 5-325 MG per tablet 1 tablet (1 tablet Oral Given by Other 09/11/22 1311)  methocarbamol (ROBAXIN) tablet 1,000 mg (1,000 mg Oral Given by Other 09/11/22 1311)     IMPRESSION /  MDM / ASSESSMENT AND PLAN / ED COURSE  I reviewed the triage vital signs and the nursing notes.   Differential diagnosis includes, but is not limited to, urolithiasis, UTI, lumbar strain, muscle skeletal pain, sciatica with right leg radiculopathy.  52 year old male presents to the ED with complaint of right-sided low back pain for approximately 1 week without known history of injury.  Patient does work as a Curator and it is possible that he is strained his low back.  Patient was given methocarbamol and hydrocodone while in the emergency department and range of motion was improved prior to discharge.  He was made aware that his urinalysis was essentially within normal limits.  He is encouraged to use ice or heat to his back and a prescription for methocarbamol, hydrocodone and naproxen was sent to the pharmacy.  He has to follow-up with his PCP if any continued problems.  Also a work note was written for him to remain  off work and he is aware that he cannot take these medications while driving.      Patient's presentation is most consistent with acute complicated illness / injury requiring diagnostic workup.  FINAL CLINICAL IMPRESSION(S) / ED DIAGNOSES   Final diagnoses:  Strain of lumbar region, initial encounter     Rx / DC Orders   ED Discharge Orders          Ordered    methocarbamol (ROBAXIN) 500 MG tablet        09/11/22 1404    HYDROcodone-acetaminophen (NORCO/VICODIN) 5-325 MG tablet  Every 6 hours PRN        09/11/22 1404    naproxen (NAPROSYN) 500 MG tablet  2 times daily with meals        09/11/22 1404             Note:  This document was prepared using Dragon voice recognition software and may include unintentional dictation errors.   Tommi Rumps, PA-C 09/11/22 1423    Jene Every, MD 09/11/22 [phone]

## 2022-09-11 NOTE — ED Triage Notes (Addendum)
Pt sts that he has been having lower right back pain. Pt sts that there has not been any strenuous activity that he has done to have this pain. Pt sts that he has increased pain when standing up from a sitting position or sitting from a lying position.

## 2022-09-11 NOTE — Discharge Instructions (Signed)
Follow-up with your primary care provider if any continued problems or concerns.  Prescriptions were sent to the pharmacy for you to begin taking.  Do not take the pain medication and muscle relaxant if you plan on driving as it could cause drowsiness and increase your risk for injury.  You may use ice or heat to your back as needed for discomfort.

## 2022-09-21 ENCOUNTER — Emergency Department
Admission: EM | Admit: 2022-09-21 | Discharge: 2022-09-22 | Disposition: A | Discharge status: Home / Self Care | Payer: Medicaid Other | Attending: Emergency Medicine | Admitting: Emergency Medicine

## 2022-09-21 ENCOUNTER — Emergency Department: Payer: Medicaid Other

## 2022-09-21 ENCOUNTER — Other Ambulatory Visit: Payer: Self-pay

## 2022-09-21 DIAGNOSIS — G8929 Other chronic pain: Secondary | ICD-10-CM | POA: Insufficient documentation

## 2022-09-21 DIAGNOSIS — M545 Low back pain, unspecified: Secondary | ICD-10-CM | POA: Diagnosis present

## 2022-09-21 DIAGNOSIS — M5441 Lumbago with sciatica, right side: Secondary | ICD-10-CM | POA: Diagnosis not present

## 2022-09-21 DIAGNOSIS — R1031 Right lower quadrant pain: Secondary | ICD-10-CM | POA: Insufficient documentation

## 2022-09-21 LAB — CBC WITH DIFFERENTIAL/PLATELET
Abs Immature Granulocytes: 0.02 10*3/uL (ref 0.00–0.07)
Basophils Absolute: 0.1 10*3/uL (ref 0.0–0.1)
Basophils Relative: 1 %
Eosinophils Absolute: 0.2 10*3/uL (ref 0.0–0.5)
Eosinophils Relative: 2 %
HCT: 43.3 % (ref 39.0–52.0)
Hemoglobin: 14.4 g/dL (ref 13.0–17.0)
Immature Granulocytes: 0 %
Lymphocytes Relative: 30 %
Lymphs Abs: 2.4 10*3/uL (ref 0.7–4.0)
MCH: 28.1 pg (ref 26.0–34.0)
MCHC: 33.3 g/dL (ref 30.0–36.0)
MCV: 84.4 fL (ref 80.0–100.0)
Monocytes Absolute: 0.7 10*3/uL (ref 0.1–1.0)
Monocytes Relative: 9 %
Neutro Abs: 4.7 10*3/uL (ref 1.7–7.7)
Neutrophils Relative %: 58 %
Platelets: 203 10*3/uL (ref 150–400)
RBC: 5.13 MIL/uL (ref 4.22–5.81)
RDW: 14.3 % (ref 11.5–15.5)
WBC: 8 10*3/uL (ref 4.0–10.5)
nRBC: 0 % (ref 0.0–0.2)

## 2022-09-21 LAB — COMPREHENSIVE METABOLIC PANEL
ALT: 22 U/L (ref 0–44)
AST: 31 U/L (ref 15–41)
Albumin: 3.9 g/dL (ref 3.5–5.0)
Alkaline Phosphatase: 58 U/L (ref 38–126)
Anion gap: 4 — ABNORMAL LOW (ref 5–15)
BUN: 16 mg/dL (ref 6–20)
CO2: 25 mmol/L (ref 22–32)
Calcium: 8.9 mg/dL (ref 8.9–10.3)
Chloride: 109 mmol/L (ref 98–111)
Creatinine, Ser: 0.98 mg/dL (ref 0.61–1.24)
GFR, Estimated: >60 mL/min (ref >60)
Glucose, Bld: 108 mg/dL — ABNORMAL HIGH (ref 70–99)
Potassium: 3.2 mmol/L — ABNORMAL LOW (ref 3.5–5.1)
Sodium: 138 mmol/L (ref 135–145)
Total Bilirubin: 0.7 mg/dL (ref 0.3–1.2)
Total Protein: 7.2 g/dL (ref 6.5–8.1)

## 2022-09-21 MED ORDER — POTASSIUM CHLORIDE CRYS ER 20 MEQ PO TBCR
40.0000 meq | EXTENDED_RELEASE_TABLET | Freq: Once | ORAL | Status: AC
  Administered 2022-09-21: 40 meq via ORAL
  Filled 2022-09-21: qty 2

## 2022-09-21 MED ORDER — DEXAMETHASONE SODIUM PHOSPHATE 10 MG/ML IJ SOLN
10.0000 mg | Freq: Once | INTRAMUSCULAR | Status: AC
  Administered 2022-09-21: 10 mg via INTRAVENOUS
  Filled 2022-09-21: qty 1

## 2022-09-21 MED ORDER — PREDNISONE 10 MG PO TABS: 10.0000 mg | ORAL_TABLET | Freq: Every day | ORAL | 0 refills | Status: DC

## 2022-09-21 MED ORDER — METHOCARBAMOL 500 MG PO TABS: 500.0000 mg | ORAL_TABLET | Freq: Three times a day (TID) | ORAL | 0 refills | Status: DC | PRN

## 2022-09-21 MED ORDER — IOHEXOL 350 MG/ML SOLN
100.0000 mL | Freq: Once | INTRAVENOUS | Status: AC | PRN
  Administered 2022-09-21: 100 mL via INTRAVENOUS

## 2022-09-21 NOTE — ED Triage Notes (Signed)
Pt to ed from home via POV for back pain x 2 weeks. Pt was seen 7/2 for same. He states "I am out of the vicodin they prescribed me". Pt now states the pain is moving from my lower back into my groin area. Pt is caox4, in no acute distress and ambulatory in triage.

## 2022-09-21 NOTE — ED Provider Notes (Signed)
[facility] Emergency Department Provider Note     Event Date/Time   First MD Initiated Contact with Patient 09/21/22 2118     (approximate)   History   Back Pain (X 2 weeks)   HPI  NALU LABA is a 52 y.o. male presents to the ED for back pain x 2 weeks. Pain localized to right lower back region with radiation down the right posterior leg.  Patient returns to the ED from visit for same complaint on 7/2.  He reports pain continues and is now radiating into his groin.  Patient denies saddle anesthesia, loss of bladder and bowel control, trauma or injury. No hematuria or vomiting.     Physical Exam   Triage Vital Signs: ED Triage Vitals [09/21/22 2031]  Encounter Vitals Group     BP (!) 141/94     Systolic BP Percentile      Diastolic BP Percentile      Pulse Rate 86     Resp 16     Temp 98 F (36.7 C)     Temp Source Oral     SpO2 100 %     Weight 257 lb 15 oz (117 kg)     Height 6' (1.829 m)     Head Circumference      Peak Flow      Pain Score 10     Pain Loc      Pain Education      Exclude from Growth Chart     Most recent vital signs: Vitals:   09/21/22 2031 09/22/22 0012  BP: (!) 141/94 128/64  Pulse: 86 88  Resp: 16 17  Temp: 98 F (36.7 C)   SpO2: 100% 99%   General: Alert and oriented. INAD.  Neck:   No cervical spine tenderness to palpation.  CV:  Good peripheral perfusion. RRR.  RESP:  Normal effort. LCTAB.  ABD:  No distention.  TTP over right lower quadrant.  No masses or organomegaly.  No CVA tenderness bilaterally. BACK:  Spinous process is midline without deformity or tenderness.  TTP over right lumbar paraspinal muscles.  MSK:   Full ROM in all joints. No swelling, deformity or tenderness.  NEURO: Cranial nerves II-XII intact. No focal deficits. Sensation and motor function intact. 5/5 LE strength bilaterally.   ED Results / Procedures / Treatments   Labs (all labs ordered are listed, but only abnormal  results are displayed) Labs Reviewed  COMPREHENSIVE METABOLIC PANEL - Abnormal; Notable for the following components:      Result Value   Potassium 3.2 (*)    Glucose, Bld 108 (*)    Anion gap 4 (*)    All other components within normal limits  CBC WITH DIFFERENTIAL/PLATELET   RADIOLOGY  I personally viewed and evaluated these images as part of my medical decision making, as well as reviewing the written report by the radiologist.  ED Provider Interpretation: CT abdomen pelvis reveals no acute abnormalities.  CT lumbar spine is unremarkable of any acute findings.  CT L-SPINE NO CHARGE  Result Date: 09/21/2022 CLINICAL DATA:  Back pain. EXAM: CT LUMBAR SPINE WITHOUT CONTRAST TECHNIQUE: Multidetector CT imaging of the lumbar spine was performed without intravenous contrast administration. Multiplanar CT image reconstructions were also generated. RADIATION DOSE REDUCTION: This exam was performed according to the departmental dose-optimization program which includes automated exposure control, adjustment of the mA and/or kV according to patient size and/or use of iterative reconstruction technique. COMPARISON:  MRI examination dated Jul 18, 2020 FINDINGS: Segmentation: 5 lumbar type vertebrae. Alignment: Straightening of the lumbar spine. Vertebrae: No acute fracture or focal pathologic process. Paraspinal and other soft tissues: Negative. Disc levels: T12-L1: No significant disc bulge, spinal canal or neural foraminal stenosis. L1-L2: No significant disc bulge, spinal canal or neural foraminal stenosis. L2-L3: Mild broad-based disc bulge with lateral recess stenosis bilaterally. Mild facet joint arthropathy. No significant neural foraminal stenosis. L3-L4: Broad-based disc bulge with moderate bilateral lateral recess stenosis. Mild right neural foraminal stenosis. L4-L5: Disc height loss and broad-based disc bulge with severe bilateral lateral recess stenosis. Moderate bilateral facet joint  arthropathy, right worse than the left with osteophytes causing right lateral recess and neural foraminal stenosis. L5-S1: Disc height loss and disc osteophyte complex with severe lateral recess stenosis. Severe bilateral facet joint arthropathy. Moderate bilateral lateral recess stenosis. Mild bilateral neural foraminal stenosis IMPRESSION: 1. No acute fracture or subluxation. 2. Multilevel degenerative disc disease with disc height loss and disc osteophyte complexes with severe bilateral lateral recess stenosis at L4-L5 and L5-S1. Moderate bilateral lateral recess stenosis at L5-S1. 3. Moderate bilateral facet joint arthropathy at L4-L5 and L5-S1 with osteophytes causing right lateral recess and neural foraminal stenosis at L4-L5. 4. Moderate bilateral lateral recess stenosis at L3-L4. 5. Mild right neural foraminal stenosis at L3-L4. Electronically Signed   By: Larose Hires D.O.   On: 09/21/2022 23:00   CT ABDOMEN PELVIS W CONTRAST  Result Date: 09/21/2022 CLINICAL DATA:  Right lower quadrant abdominal pain. EXAM: CT ABDOMEN AND PELVIS WITH CONTRAST TECHNIQUE: Multidetector CT imaging of the abdomen and pelvis was performed using the standard protocol following bolus administration of intravenous contrast. RADIATION DOSE REDUCTION: This exam was performed according to the departmental dose-optimization program which includes automated exposure control, adjustment of the mA and/or kV according to patient size and/or use of iterative reconstruction technique. CONTRAST:  OMNIPAQUE IOHEXOL 350 MG/ML SOLN COMPARISON:  CT examination dated October 28, 2016 FINDINGS: Lower chest: No acute abnormality. Hepatobiliary: No focal liver abnormality is seen. 4 mm gallstone in the gallbladder neck without gallbladder wall thickening or pericholecystic inflammatory changes. No biliary ductal dilatation. Pancreas: Unremarkable. No pancreatic ductal dilatation or surrounding inflammatory changes. Spleen: Normal in size  without focal abnormality. Adrenals/Urinary Tract: Adrenal glands are unremarkable. Kidneys are normal, without renal calculi, focal lesion, or hydronephrosis. Bilateral renal cortical cysts measuring up to 1.5 cm, likely benign Bosniak type 1 cysts. Bladder is unremarkable. Stomach/Bowel: Stomach is within normal limits. Appendix appears normal. No evidence of bowel wall thickening, distention, or inflammatory changes. Vascular/Lymphatic: No significant vascular findings are present. No enlarged abdominal or pelvic lymph nodes. Reproductive: Prostate is unremarkable. Other: Small fat containing left inguinal hernia. No abdominopelvic ascites. Musculoskeletal: No acute or significant osseous findings. IMPRESSION: 1. No CT evidence of acute abdominal/pelvic process. 2. 4 mm gallstone in the gallbladder neck without gallbladder wall thickening or pericholecystic inflammatory changes. 3. Normal appendix. No evidence of colitis or diverticulitis. 4. Small fat containing left inguinal hernia. 5. Bilateral renal cortical cysts measuring up to 1.5 cm, likely benign Bosniak type 1 cysts. Electronically Signed   By: Larose Hires D.O.   On: 09/21/2022 22:52    PROCEDURES:  Critical Care performed: No  Procedures   MEDICATIONS ORDERED IN ED: Medications  iohexol (OMNIPAQUE) 350 MG/ML injection 100 mL (100 mLs Intravenous Contrast Given 09/21/22 2232)  potassium chloride SA (KLOR-CON M) CR tablet 40 mEq (40 mEq Oral Given 09/21/22 2315)  dexamethasone (DECADRON)  injection 10 mg (10 mg Intravenous Given 09/21/22 2316)   IMPRESSION / MDM / ASSESSMENT AND PLAN / ED COURSE  I reviewed the triage vital signs and the nursing notes.                               52 y.o. male presents to the emergency department for evaluation and treatment of acute back pain with radiation into groin. See HPI for further details.  Physical exam is pertinent for right lower quadrant tenderness upon palpation.  Differential diagnosis  includes, but is not limited to radiculopathy, appendicitis, nephrolithiasis    Basic labs ordered.  Potassium 3.2.  Patient will be administered potassium tablet.  CT abdomen pelvis revealed no acute abnormalities including appendicitis.  This is reassuring.  Please see radiologist final read for complete study of CT abdomen.  Patient is administered Decadron for his pain.  He is stable for discharge home.  He will be provided with a short course of prednisone and a muscle relaxer in which he reports helped him when discharged from his last ED visit.  He is to follow-up with orthopedic in 3 days.  Encouraged to closely monitor symptoms.  Provided with ED precautions.  Patient verbalizes understanding. All questions and concerns were addressed during ED visit.    Patient's presentation is most consistent with acute complicated illness / injury requiring diagnostic workup.  FINAL CLINICAL IMPRESSION(S) / ED DIAGNOSES   Final diagnoses:  Chronic low back pain with sciatica, sciatica laterality unspecified, unspecified back pain laterality    Rx / DC Orders   ED Discharge Orders          Ordered    predniSONE (DELTASONE) 10 MG tablet  Daily        09/21/22 2335    methocarbamol (ROBAXIN) 500 MG tablet  Every 8 hours PRN        09/21/22 2335             Note:  This document was prepared using Dragon voice recognition software and may include unintentional dictation errors.    Romeo Apple, Abir Eroh A, PA-C 09/22/22 1250    Sharman Cheek, MD 09/22/22 2259

## 2022-09-23 ENCOUNTER — Encounter: Payer: Self-pay | Admitting: Intensive Care

## 2022-09-23 ENCOUNTER — Emergency Department: Payer: Medicaid Other

## 2022-09-23 ENCOUNTER — Other Ambulatory Visit: Payer: Self-pay

## 2022-09-23 ENCOUNTER — Emergency Department
Admission: EM | Admit: 2022-09-23 | Discharge: 2022-09-23 | Disposition: A | Discharge status: Home / Self Care | Payer: Medicaid Other | Attending: Emergency Medicine | Admitting: Emergency Medicine

## 2022-09-23 DIAGNOSIS — N50811 Right testicular pain: Secondary | ICD-10-CM | POA: Diagnosis not present

## 2022-09-23 DIAGNOSIS — M5126 Other intervertebral disc displacement, lumbar region: Secondary | ICD-10-CM | POA: Insufficient documentation

## 2022-09-23 DIAGNOSIS — M545 Low back pain, unspecified: Secondary | ICD-10-CM | POA: Diagnosis present

## 2022-09-23 DIAGNOSIS — M47816 Spondylosis without myelopathy or radiculopathy, lumbar region: Secondary | ICD-10-CM | POA: Diagnosis not present

## 2022-09-23 DIAGNOSIS — M5136 Other intervertebral disc degeneration, lumbar region: Secondary | ICD-10-CM | POA: Diagnosis not present

## 2022-09-23 DIAGNOSIS — M5137 Other intervertebral disc degeneration, lumbosacral region: Secondary | ICD-10-CM | POA: Diagnosis not present

## 2022-09-23 MED ORDER — HYDROMORPHONE HCL 1 MG/ML IJ SOLN
1.0000 mg | Freq: Once | INTRAMUSCULAR | Status: AC
  Administered 2022-09-23: 1 mg via INTRAVENOUS
  Filled 2022-09-23: qty 1

## 2022-09-23 MED ORDER — KETOROLAC TROMETHAMINE 30 MG/ML IJ SOLN
30.0000 mg | Freq: Once | INTRAMUSCULAR | Status: AC
  Administered 2022-09-23: 30 mg via INTRAVENOUS
  Filled 2022-09-23: qty 1

## 2022-09-23 MED ORDER — MELOXICAM 15 MG PO TABS: 15.0000 mg | ORAL_TABLET | Freq: Every day | ORAL | 0 refills | Status: DC

## 2022-09-23 MED ORDER — OXYCODONE-ACETAMINOPHEN 5-325 MG PO TABS
1.0000 | ORAL_TABLET | ORAL | Status: DC | PRN
  Administered 2022-09-23: 1 via ORAL
  Filled 2022-09-23: qty 1

## 2022-09-23 MED ORDER — DEXAMETHASONE SODIUM PHOSPHATE 10 MG/ML IJ SOLN
10.0000 mg | Freq: Once | INTRAMUSCULAR | Status: AC
  Administered 2022-09-23: 10 mg via INTRAVENOUS
  Filled 2022-09-23: qty 1

## 2022-09-23 MED ORDER — DIAZEPAM 2 MG PO TABS: 2.0000 mg | ORAL_TABLET | Freq: Three times a day (TID) | ORAL | 0 refills | Status: DC | PRN

## 2022-09-23 MED ORDER — ONDANSETRON HCL 4 MG/2ML IJ SOLN
4.0000 mg | Freq: Once | INTRAMUSCULAR | Status: AC
  Administered 2022-09-23: 4 mg via INTRAVENOUS
  Filled 2022-09-23: qty 2

## 2022-09-23 MED ORDER — OXYCODONE-ACETAMINOPHEN 5-325 MG PO TABS: 1.0000 | ORAL_TABLET | Freq: Four times a day (QID) | ORAL | 0 refills | Status: DC | PRN

## 2022-09-23 MED ORDER — DIAZEPAM 2 MG PO TABS
2.0000 mg | ORAL_TABLET | Freq: Once | ORAL | Status: AC
  Administered 2022-09-23: 2 mg via ORAL
  Filled 2022-09-23: qty 1

## 2022-09-23 MED ORDER — METHYLPREDNISOLONE 4 MG PO TBPK: ORAL_TABLET | ORAL | 0 refills | Status: DC

## 2022-09-23 NOTE — ED Provider Notes (Signed)
[facility] Provider Note  Patient Contact: 4:39 PM (approximate)   History   Leg Pain   HPI  Mike Herrera is a 52 y.o. male who presents emergency department complaining of low back pain radiating into his right testicle and down into his right leg.  Patient has had history of back problems.  Over the last 2 weeks he has had a worsening of symptoms.  He has been in this department twice with steadily worsening of complaints.  Patient had some pain in his flank radiating into his abdomen on his last encounter.  CT scan of his lumbar spine and abdomen pelvis were obtained.  Multilevel degenerative changes but no acute intra-abdominal findings.  Patient states that he now has sharp right testicle pain with no known numbness or tingling.  No bowel or bladder dysfunction but he is now complaining that his right thigh is numb.  The symptoms are atypical for his typical back pain.  Patient denies any fevers, chills.  No direct trauma to the spine.  No urinary or GI complaints.     Physical Exam   Triage Vital Signs: ED Triage Vitals  Encounter Vitals Group     BP 09/23/22 1459 (!) 144/106     Systolic BP Percentile --      Diastolic BP Percentile --      Pulse Rate 09/23/22 1459 80     Resp 09/23/22 1459 16     Temp 09/23/22 1458 98.2 F (36.8 C)     Temp Source 09/23/22 1458 Oral     SpO2 09/23/22 1459 98 %     Weight 09/23/22 1458 257 lb 15 oz (117 kg)     Height 09/23/22 1458 6' (1.829 m)     Head Circumference --      Peak Flow --      Pain Score 09/23/22 1458 10     Pain Loc --      Pain Education --      Exclude from Growth Chart --     Most recent vital signs: Vitals:   09/23/22 1458 09/23/22 1459  BP:  (!) 144/106  Pulse:  80  Resp:  16  Temp: 98.2 F (36.8 C)   SpO2:  98%     General: Alert and in no acute distress.  Cardiovascular:  Good peripheral perfusion Respiratory: Normal respiratory effort without tachypnea or retractions.  Lungs CTAB. Good air entry to the bases with no decreased or absent breath sounds. Gastrointestinal: Bowel sounds 4 quadrants. Soft and nontender to palpation. No guarding or rigidity. No palpable masses. No distention. No CVA tenderness. Musculoskeletal: Full range of motion to all extremities.  Visualization of spine reveals no visible signs of trauma.  Tenderness to the right spinal border of the lumbar spine extending into the paraspinal muscle, SI joint and sciatic notch.  Patient has a palpable abnormality.  Pulses and sensation intact and equal bilateral lower extremities with the exception of the right thigh.  He does have slight decrease sensation in the right thigh when compared to left.  Sensation is still present just slightly less. Neurologic:  No gross focal neurologic deficits are appreciated.  Skin:   No rash noted Other:   ED Results / Procedures / Treatments   Labs (all labs ordered are listed, but only abnormal results are displayed) Labs Reviewed - No data to display   EKG     RADIOLOGY  I personally viewed, evaluated, and interpreted these images  as part of my medical decision making, as well as reviewing the written report by the radiologist.  ED Provider Interpretation: No concerning findings on ultrasound of the testicle.  Patient has pain consistent with a herniated disc with impingement on the L2 nerve root right side.  This matches patient's symptoms.  No concerning central cord compression.  MR LUMBAR SPINE WO CONTRAST  Result Date: 09/23/2022 CLINICAL DATA:  Low back pain radiating to the right leg EXAM: MRI LUMBAR SPINE WITHOUT CONTRAST TECHNIQUE: Multiplanar, multisequence MR imaging of the lumbar spine was performed. No intravenous contrast was administered. COMPARISON:  07/18/2020 and lumbar spine CT from 09/21/2022 FINDINGS: Segmentation: The lowest lumbar type non-rib-bearing vertebra is labeled as L5. Alignment:  3 mm degenerative retrolisthesis at  L3-4. Vertebrae: Disc desiccation throughout the lumbar spine with loss of disc height at L5-S1. Type 2 degenerative endplate findings at L5-S1 and L4-5. Congenitally short pedicles in the lumbar spine. Conus medullaris and cauda equina: Conus extends to the L1 level. Conus and cauda equina appear normal. Paraspinal and other soft tissues: Unremarkable Disc levels: L1-2: No impingement.  Mild disc bulge L2-3: Prominent impingement on the right L2 nerve as it approaches the right neural foramen due to a right lateral recess disc extrusion or disc fragment extending cephalad as shown on image 8 series 9 and image 8 series 5. This also contributes to mild right eccentric central narrowing of the thecal sac and mild right subarticular lateral recess stenosis. The right lateral recess ectopic disc material is new compared to the prior exam. L3-4: Moderate right and borderline left subarticular lateral recess stenosis and mild right foraminal stenosis as well as moderate right eccentric central narrowing of the thecal sac due to disc bulge, right lateral recess disc protrusion, and mild facet arthropathy along with short pedicles. Impingement at this level is reduced from prior given reduction in size of the right eccentric disc protrusion. L4-5: Moderate right and mild left foraminal stenosis with moderate right subarticular lateral recess stenosis and moderate right eccentric central narrowing of the thecal sac due to facet arthropathy, ligamentum flavum redundancy, short pedicles, and disc bulge. Right facet encroachment mildly worsened from prior. L5-S1: Mild left foraminal stenosis due to intervertebral spurring, disc bulge, and facet arthropathy. This is similar to the prior exam. There is also a synovial cyst posterior to the facet joint and not in a position to cause impingement. IMPRESSION: 1. New right lateral recess disc extrusion or disc fragment extending cephalad from the L2-3 level exerting prominent mass  effect on the right L2 nerve. Mild right eccentric central narrowing of the thecal sac and mild right subarticular lateral recess stenosis at this level as well. 2. Lumbar spondylosis, degenerative disc disease, and short pedicles also cause moderate impingement at L3-4 and L4-5, and mild impingement at L5-S1. The impingement at L4-5 is worsened and the impingement at L3-4 is improved compared to previous lumbar MRI of 07/18/2020. Electronically Signed   By: Gaylyn Rong M.D.   On: 09/23/2022 20:12   US SCROTUM W/DOPPLER  Result Date: 09/23/2022 CLINICAL DATA:  Right testicular pain. EXAM: SCROTAL ULTRASOUND DOPPLER ULTRASOUND OF THE TESTICLES TECHNIQUE: Complete ultrasound examination of the testicles, epididymis, and other scrotal structures was performed. Color and spectral Doppler ultrasound were also utilized to evaluate blood flow to the testicles. COMPARISON:  None Available. FINDINGS: Right testicle Measurements: 4.0 x 2.4 x 2.7 cm. No mass or microlithiasis visualized. Left testicle Measurements: 3.8 x 2.5 x 2.9 cm. No mass or  microlithiasis visualized. Right epididymis:  Normal in size and appearance. Left epididymis:  Normal in size and appearance. Hydrocele:  None visualized. Varicocele:  None visualized. Pulsed Doppler interrogation of both testes demonstrates normal low resistance arterial and venous waveforms bilaterally. IMPRESSION: Normal testicular ultrasound. Electronically Signed   By: Ted Mcalpine M.D.   On: 09/23/2022 18:42    PROCEDURES:  Critical Care performed: No  Procedures   MEDICATIONS ORDERED IN ED: Medications  oxyCODONE-acetaminophen (PERCOCET/ROXICET) 5-325 MG per tablet 1 tablet (1 tablet Oral Given 09/23/22 1501)  HYDROmorphone (DILAUDID) injection 1 mg (has no administration in time range)  diazepam (VALIUM) tablet 2 mg (has no administration in time range)  ketorolac (TORADOL) 30 MG/ML injection 30 mg (has no administration in time range)   HYDROmorphone (DILAUDID) injection 1 mg (1 mg Intravenous Given 09/23/22 1739)  ondansetron (ZOFRAN) injection 4 mg (4 mg Intravenous Given 09/23/22 1736)  dexamethasone (DECADRON) injection 10 mg (10 mg Intravenous Given 09/23/22 1737)  diazepam (VALIUM) tablet 2 mg (2 mg Oral Given 09/23/22 1737)     IMPRESSION / MDM / ASSESSMENT AND PLAN / ED COURSE  I reviewed the triage vital signs and the nursing notes.                                 Differential diagnosis includes, but is not limited to, sciatica, lumbar radiculopathy, cauda equina, herniated disc, torsion   Patient's presentation is most consistent with acute presentation with potential threat to life or bodily function.   Patient's diagnosis is consistent with herniated intervertebral disc of the lumbar spine.  Patient presents the emergency department with back pain radiating into the right leg and testicle region.  Patient had a recent CT scan which revealed degenerative changes to the lumbar spine but no surgical compression.  Given the worsening symptoms I proceeded with MRI which reveals what appears to be a new herniated disc with impingement on the L2 nerve root.  There is no central cord compression.  No concerning neurodeficits on exam.  Follow-up with neurosurgery for further management.  Patient will have anti-inflammatory, steroid, Valium as a muscle relaxer and pain medication for symptom relief.  Return precautions discussed with the patient.  Follow-up again with neurosurgery. Patient is given ED precautions to return to the ED for any worsening or new symptoms.     FINAL CLINICAL IMPRESSION(S) / ED DIAGNOSES   Final diagnoses:  Herniated lumbar intervertebral disc     Rx / DC Orders   ED Discharge Orders          Ordered    oxyCODONE-acetaminophen (PERCOCET/ROXICET) 5-325 MG tablet  Every 6 hours PRN        09/23/22 2050    diazepam (VALIUM) 2 MG tablet  Every 8 hours PRN        09/23/22 2050     methylPREDNISolone (MEDROL DOSEPAK) 4 MG TBPK tablet        09/23/22 2050    meloxicam (MOBIC) 15 MG tablet  Daily        09/23/22 2050             Note:  This document was prepared using Dragon voice recognition software and may include unintentional dictation errors.   Lanette Hampshire 09/23/22 2052    Dionne Bucy, MD 09/24/22 [phone]

## 2022-09-23 NOTE — ED Notes (Signed)
Patient taken to ultrasound.

## 2022-09-23 NOTE — ED Notes (Signed)
Patient refused wheelchair from this RN at this time

## 2022-09-23 NOTE — ED Notes (Signed)
Patient c/o a feeling of numbness in right upper thigh to knee. Patient states "it feels like a knot" in right upper thigh. Patient denies any right foot pain or numbness. Patient c/o pain in right testicle.

## 2022-09-23 NOTE — ED Triage Notes (Addendum)
Patient c/o pain from right hip that radiates to groin and down to foot. Also reports numbness with the pain that started last night from right hip to knee.  Seen for same 09/21/22   CT of abdomen/pelvis and spine completed during last visit and reports no remarkable findings

## 2022-10-20 ENCOUNTER — Other Ambulatory Visit: Payer: Self-pay

## 2022-10-20 ENCOUNTER — Emergency Department
Admission: EM | Admit: 2022-10-20 | Discharge: 2022-10-20 | Disposition: A | Discharge status: Home / Self Care | Payer: Medicaid Other | Attending: Emergency Medicine | Admitting: Emergency Medicine

## 2022-10-20 DIAGNOSIS — M5416 Radiculopathy, lumbar region: Secondary | ICD-10-CM | POA: Insufficient documentation

## 2022-10-20 DIAGNOSIS — M549 Dorsalgia, unspecified: Secondary | ICD-10-CM | POA: Diagnosis present

## 2022-10-20 MED ORDER — LIDOCAINE 5 % EX PTCH: 1.0000 | MEDICATED_PATCH | Freq: Two times a day (BID) | CUTANEOUS | 0 refills | Status: AC

## 2022-10-20 MED ORDER — DEXAMETHASONE SODIUM PHOSPHATE 10 MG/ML IJ SOLN
10.0000 mg | Freq: Once | INTRAMUSCULAR | Status: AC
  Administered 2022-10-20: 10 mg via INTRAVENOUS
  Filled 2022-10-20: qty 1

## 2022-10-20 MED ORDER — PREDNISONE 10 MG (21) PO TBPK: ORAL_TABLET | ORAL | 0 refills | Status: DC

## 2022-10-20 MED ORDER — LIDOCAINE 5 % EX PTCH
1.0000 | MEDICATED_PATCH | CUTANEOUS | Status: DC
  Administered 2022-10-20: 1 via TRANSDERMAL
  Filled 2022-10-20: qty 1

## 2022-10-20 MED ORDER — NAPROXEN 500 MG PO TABS: 500.0000 mg | ORAL_TABLET | Freq: Two times a day (BID) | ORAL | 0 refills | Status: AC

## 2022-10-20 MED ORDER — KETOROLAC TROMETHAMINE 15 MG/ML IJ SOLN
15.0000 mg | Freq: Once | INTRAMUSCULAR | Status: AC
  Administered 2022-10-20: 15 mg via INTRAVENOUS
  Filled 2022-10-20: qty 1

## 2022-10-20 NOTE — Discharge Instructions (Signed)
Please follow-up with the spine doctor as soon as you can.  You may take the medications as prescribed.  Please return to the emergency department for any new, worsening, or changing symptoms or other concerns including weakness in your legs, urinary or stool incontinence or retention, numbness or tingling in your extremities/buttocks/groin, fevers, or any other concerns or change in symptoms.

## 2022-10-20 NOTE — ED Triage Notes (Signed)
Pt reports hx of disc problems in his back, was seen here several weeks ago and given some medications. Pt reports got better but then stepped in a hole in the yard and fell 3 days ago and now it hurts again. Pt reports pain radiates down right leg.

## 2022-10-20 NOTE — ED Provider Notes (Signed)
[facility] Provider Note    Event Date/Time   First MD Initiated Contact with Patient 10/20/22 [phone]     (approximate)   History   Back Pain   HPI  Mike Herrera is a 52 y.o. male with a past medical history of known herniated disc who presents today for evaluation of back pain.  Patient reports that he has a known herniated disc and has been seen in the emergency department.  He was given medicine which has helped his pain, however he reports that he stepped in a pothole accidentally 3 days ago and thinks that he has re-exacerbated his pain.  He denies urinary or fecal incontinence or retention.  He reports that the pain radiates down his right leg as it did previously.  No fevers or chills.  He is still able to ambulate.  He does not have any more of the medication that was prescribed to him earlier.  There are no problems to display for this patient.         Physical Exam   Triage Vital Signs: ED Triage Vitals  Encounter Vitals Group     BP 10/20/22 0728 (!) 145/97     Systolic BP Percentile --      Diastolic BP Percentile --      Pulse Rate 10/20/22 0728 82     Resp 10/20/22 0728 20     Temp 10/20/22 0728 98.1 F (36.7 C)     Temp Source 10/20/22 0728 Oral     SpO2 10/20/22 0728 97 %     Weight 10/20/22 0727 260 lb (117.9 kg)     Height 10/20/22 0727 6\' 1"  (1.854 m)     Head Circumference --      Peak Flow --      Pain Score 10/20/22 0727 10     Pain Loc --      Pain Education --      Exclude from Growth Chart --     Most recent vital signs: Vitals:   10/20/22 0728  BP: (!) 145/97  Pulse: 82  Resp: 20  Temp: 98.1 F (36.7 C)  SpO2: 97%    Physical Exam Vitals and nursing note reviewed.  Constitutional:      General: Awake and alert. No acute distress.    Appearance: Normal appearance. The patient is normal weight.  HENT:     Head: Normocephalic and atraumatic.     Mouth: Mucous membranes are moist.  Eyes:     General:  PERRL. Normal EOMs        Right eye: No discharge.        Left eye: No discharge.     Conjunctiva/sclera: Conjunctivae normal.  Cardiovascular:     Rate and Rhythm: Normal rate and regular rhythm.     Pulses: Normal pulses.  Pulmonary:     Effort: Pulmonary effort is normal. No respiratory distress.     Breath sounds: Normal breath sounds.  Abdominal:     Abdomen is soft. There is no abdominal tenderness. No rebound or guarding. No distention. Musculoskeletal:        General: No swelling. Normal range of motion.     Cervical back: Normal range of motion and neck supple.  Back: No midline tenderness. Strength and sensation 5/5 to bilateral lower extremities. Normal great toe extension against resistance. Normal sensation throughout feet. Normal patellar reflexes. Positive SLR on the right and opposite SLR bilaterally. Negative FABER test Skin:  General: Skin is warm and dry.     Capillary Refill: Capillary refill takes less than 2 seconds.     Findings: No rash.  Neurological:     Mental Status: The patient is awake and alert.      ED Results / Procedures / Treatments   Labs (all labs ordered are listed, but only abnormal results are displayed) Labs Reviewed - No data to display   EKG     RADIOLOGY     PROCEDURES:  Critical Care performed:   Procedures   MEDICATIONS ORDERED IN ED: Medications  lidocaine (LIDODERM) 5 % 1 patch (1 patch Transdermal Patch Applied 10/20/22 0757)  dexamethasone (DECADRON) injection 10 mg (10 mg Intravenous Given 10/20/22 0756)  ketorolac (TORADOL) 15 MG/ML injection 15 mg (15 mg Intravenous Given 10/20/22 0757)     IMPRESSION / MDM / ASSESSMENT AND PLAN / ED COURSE  I reviewed the triage vital signs and the nursing notes.   Differential diagnosis includes, but is not limited to, acute on chronic back pain, lumbar radiculopathy, disc herniation.  I reviewed the patient's chart.  He has been seen multiple times with the same  complaint.  He had a CT scan on 09/21/2022 of his abdomen and pelvis that revealed no acute findings.  He had a CT L-spine on the same day that revealed multilevel degenerative disc disease with disc height loss and lateral recess stenosis and foraminal stenosis diffusely.  He returned 2 days later with the same pain, now radiating into his testicles.  He had a testicular ultrasound which was negative.  He subsequently underwent MRI of his lumbar spine which revealed a disc extrusion from L2-L3 pressing on the right L2 nerve as well as impingement at L3-L4 and L4-L5, and mild impingement at L5-S1.  He was discharged on a Medrol Dosepak, meloxicam, and oxycodone.  This is a 52 year old male with a history of back pain who presents with back pain that feels exactly the same as his previous back pain exacerbations.  He has 5 out of 5 strength with intact sensation to extensor hallucis dorsiflexion and plantarflexion of bilateral lower extremities with normal patellar reflexes bilaterally. Most likely etiology at this point is muscle strain vs herniated disc. No red flags to indicate patient is at risk for more auspicious process that would require urgent/emergent spinal imaging or subspecialty evaluation at this time. No major trauma, no midline tenderness, no history or physical exam findings to suggest cauda equina syndrome or spinal cord compression. No focal neurological deficits on exam. No constitutional symptoms or history of immunosuppression or IVDA to suggest potential for epidural abscess. Not anticoagulated, no history of bleeding diastasis to suggest risk for epidural hematoma. No chronic steroid use or advanced age or history of malignancy to suggest proclivity towards pathological fracture.  No abdominal pain or flank pain to suggest kidney stone, no history of kidney stone.  No fever or dysuria or CVAT to suggest pyelonephritis.  No chest pain, back pain, shortness of breath, neurological deficits, to  suggest vascular catastrophe, and pulses are equal in all 4 extremities.    Patient was treated symptomatically in the emergency department with improvement of his symptoms.  He is able to ambulate with a steady gait.  This the medications were sent to his pharmacy per his request.  He was again given the information for the spine doctor and encouraged to make an appointment for outpatient follow-up.  Discussed care instructions and return precautions with patient.  We discussed  return precautions for any worsening or different pain or development of any neurologic symptoms. Educated patient regarding expected time course for back pain to improve and recommended very close outpatient follow-up.  Patient and his wife understand and agree with plan.  He was discharged in stable condition.  Patient's presentation is most consistent with acute complicated illness / injury requiring diagnostic workup.    FINAL CLINICAL IMPRESSION(S) / ED DIAGNOSES   Final diagnoses:  Lumbar radiculopathy     Rx / DC Orders   ED Discharge Orders          Ordered    predniSONE (STERAPRED UNI-PAK 21 TAB) 10 MG (21) TBPK tablet        10/20/22 0843    naproxen (NAPROSYN) 500 MG tablet  2 times daily with meals        10/20/22 0843    lidocaine (LIDODERM) 5 %  Every 12 hours        10/20/22 0843             Note:  This document was prepared using Dragon voice recognition software and may include unintentional dictation errors.   Jackelyn Hoehn, PA-C 10/20/22 1122    Merwyn Katos, MD 10/20/22 [phone]

## 2022-10-21 ENCOUNTER — Other Ambulatory Visit: Payer: Self-pay

## 2022-10-21 ENCOUNTER — Emergency Department
Admission: EM | Admit: 2022-10-21 | Discharge: 2022-10-21 | Disposition: A | Discharge status: Home / Self Care | Payer: Medicaid Other | Attending: Emergency Medicine | Admitting: Emergency Medicine

## 2022-10-21 DIAGNOSIS — R457 State of emotional shock and stress, unspecified: Secondary | ICD-10-CM | POA: Diagnosis not present

## 2022-10-21 DIAGNOSIS — M5430 Sciatica, unspecified side: Secondary | ICD-10-CM

## 2022-10-21 DIAGNOSIS — G8929 Other chronic pain: Secondary | ICD-10-CM | POA: Diagnosis not present

## 2022-10-21 DIAGNOSIS — M549 Dorsalgia, unspecified: Secondary | ICD-10-CM | POA: Diagnosis not present

## 2022-10-21 DIAGNOSIS — M5441 Lumbago with sciatica, right side: Secondary | ICD-10-CM | POA: Diagnosis not present

## 2022-10-21 DIAGNOSIS — M545 Low back pain, unspecified: Secondary | ICD-10-CM | POA: Diagnosis present

## 2022-10-21 MED ORDER — OXYCODONE HCL 5 MG PO TABS: 5.0000 mg | ORAL_TABLET | Freq: Three times a day (TID) | ORAL | 0 refills | Status: DC | PRN

## 2022-10-21 MED ORDER — ACETAMINOPHEN 500 MG PO TABS
1000.0000 mg | ORAL_TABLET | Freq: Once | ORAL | Status: AC
  Administered 2022-10-21: 1000 mg via ORAL
  Filled 2022-10-21: qty 2

## 2022-10-21 MED ORDER — LIDOCAINE 5 % EX PTCH
1.0000 | MEDICATED_PATCH | CUTANEOUS | Status: DC
  Filled 2022-10-21: qty 1

## 2022-10-21 MED ORDER — DIAZEPAM 2 MG PO TABS
2.0000 mg | ORAL_TABLET | Freq: Once | ORAL | Status: AC
  Administered 2022-10-21: 2 mg via ORAL
  Filled 2022-10-21: qty 1

## 2022-10-21 MED ORDER — KETOROLAC TROMETHAMINE 30 MG/ML IJ SOLN
30.0000 mg | Freq: Once | INTRAMUSCULAR | Status: AC
  Administered 2022-10-21: 30 mg via INTRAMUSCULAR
  Filled 2022-10-21: qty 1

## 2022-10-21 MED ORDER — DIAZEPAM 5 MG PO TABS: 5.0000 mg | ORAL_TABLET | Freq: Once | ORAL | Status: DC

## 2022-10-21 NOTE — Discharge Instructions (Addendum)
Take medications as prescribed.  Thank you for choosing [place] for your health care today!  Please see your primary doctor this week for a follow up appointment.   If you have any new, worsening, or unexpected symptoms call your doctor right away or come back to the emergency department for reevaluation.  It was my pleasure to care for you today.   Daneil Dan Modesto Charon, MD

## 2022-10-21 NOTE — ED Provider Notes (Signed)
[facility] Provider Note    Event Date/Time   First MD Initiated Contact with Patient 10/21/22 1446     (approximate)   History   Back Pain   HPI  SOSUKE CORTRIGHT is a 52 y.o. male   Past medical history of chronic lower back pain with sciatica presents to the emergency department with back pain consistent with prior.  Recently he stepped in a pothole and had recurrence of his pain.  Right-sided lower back pain with radiation down the right thigh.  He was seen in the hospital yesterday and discharged with pain medications.  He has ongoing pain.  No changes no new injuries.  No incontinence.  External Medical Documents Reviewed: MRI from September 23, 2022 with disc extrusion at the L2-L3 level with mass effect on the right L2 nerve      Physical Exam   Triage Vital Signs: ED Triage Vitals  Encounter Vitals Group     BP 10/21/22 1412 135/86     Systolic BP Percentile --      Diastolic BP Percentile --      Pulse Rate 10/21/22 1412 75     Resp 10/21/22 1412 18     Temp 10/21/22 1412 98.2 F (36.8 C)     Temp src --      SpO2 10/21/22 1412 97 %     Weight 10/21/22 1410 257 lb 15 oz (117 kg)     Height 10/21/22 1410 6\' 1"  (1.854 m)     Head Circumference --      Peak Flow --      Pain Score 10/21/22 1410 10     Pain Loc --      Pain Education --      Exclude from Growth Chart --     Most recent vital signs: Vitals:   10/21/22 1412  BP: 135/86  Pulse: 75  Resp: 18  Temp: 98.2 F (36.8 C)  SpO2: 97%    General: Awake, no distress.  CV:  Good peripheral perfusion.  Resp:  Normal effort.  Abd:  No distention.  Other:  Right-sided paraspinal tenderness in the lumbar region.  Motor sensor intact bilateral lower extremities.  Normal vital signs afebrile.   ED Results / Procedures / Treatments   Labs (all labs ordered are listed, but only abnormal results are displayed) Labs Reviewed - No data to display  PROCEDURES:  Critical Care  performed: No  Procedures   MEDICATIONS ORDERED IN ED: Medications  lidocaine (LIDODERM) 5 % 1 patch (1 patch Transdermal Not Given 10/21/22 1456)  ketorolac (TORADOL) 30 MG/ML injection 30 mg (30 mg Intramuscular Given 10/21/22 1453)  acetaminophen (TYLENOL) tablet 1,000 mg (1,000 mg Oral Given 10/21/22 1453)  diazepam (VALIUM) tablet 2 mg (2 mg Oral Given 10/21/22 1453)     IMPRESSION / MDM / ASSESSMENT AND PLAN / ED COURSE  I reviewed the triage vital signs and the nursing notes.                                Patient's presentation is most consistent with acute presentation with potential threat to life or bodily function.  Differential diagnosis includes, but is not limited to, lumbar radiculopathy, sciatica, cord compression, fracture dislocation   The patient is on the cardiac monitor to evaluate for evidence of arrhythmia and/or significant heart rate changes.  MDM:    Patient with ongoing pain  from a recent exacerbation of his lower back pain with MRI recently showing disc extrusion with right L2 nerve impingement, without any red flag symptoms to suggest more sinister pathologies like cord compression and no new trauma to suggest fractures or dislocation.  Will give pain management in the emergency department and a short course of oxycodone which has worked well for him in the past.  He will follow-up with [facility] neurosurgeon spine specialist Dr. Adriana Simas for follow-up appointment.       FINAL CLINICAL IMPRESSION(S) / ED DIAGNOSES   Final diagnoses:  Sciatica, unspecified laterality  Chronic low back pain with sciatica, sciatica laterality unspecified, unspecified back pain laterality     Rx / DC Orders   ED Discharge Orders          Ordered    oxyCODONE (ROXICODONE) 5 MG immediate release tablet  Every 8 hours PRN        10/21/22 1506             Note:  This document was prepared using Dragon voice recognition software and may include  unintentional dictation errors.    Pilar Jarvis, MD 10/21/22 [phone]

## 2022-10-21 NOTE — ED Triage Notes (Signed)
See first nurse note. 

## 2022-10-21 NOTE — ED Triage Notes (Signed)
First nurse note: pt to ED [facility] from home for back pain while getting groceries out of car. Hx herniated disc Took vicodin

## 2022-11-03 ENCOUNTER — Emergency Department
Admission: EM | Admit: 2022-11-03 | Discharge: 2022-11-03 | Disposition: A | Discharge status: Home / Self Care | Payer: Medicaid Other | Source: Home / Self Care | Attending: Emergency Medicine | Admitting: Emergency Medicine

## 2022-11-03 ENCOUNTER — Other Ambulatory Visit: Payer: Self-pay

## 2022-11-03 DIAGNOSIS — R21 Rash and other nonspecific skin eruption: Secondary | ICD-10-CM

## 2022-11-03 DIAGNOSIS — L509 Urticaria, unspecified: Secondary | ICD-10-CM

## 2022-11-03 MED ORDER — DIPHENHYDRAMINE HCL 25 MG PO CAPS: 50.0000 mg | ORAL_CAPSULE | Freq: Once | ORAL | Status: DC

## 2022-11-03 NOTE — Discharge Instructions (Addendum)
Continue to use Benadryl as needed for itchy sensation and the rash.  25-50 mg (1-2 tablets)  If you develop any further worsening symptoms please return to the ED

## 2022-11-03 NOTE — ED Triage Notes (Signed)
Pt to ed from home via POV for a rash that is all over his body. Pt denies any SOB. Pt noticed it 2 days ago. He has taken PO benadryl once with no relief. Pt states rash is very itchy at times. Pt is caox4, in no acute distress and ambulatory in triage.

## 2022-11-03 NOTE — ED Provider Notes (Signed)
[facility] Provider Note    Event Date/Time   First MD Initiated Contact with Patient 11/03/22 0149     (approximate)   History   Rash (X 2 days)   HPI  Mike Herrera is a 52 y.o. male who presents to the ED for evaluation of Rash (X 2 days)   Patient presents with his wife for evaluation of a couple days of a diffuse pruritic rash.  Reports he has also had a couple days of nasal congestion and postnasal drip.  No dyspnea, cough or chest pain.  No abdominal pain or emesis.  Took some Benadryl last night with transient improvement of his symptoms.   Physical Exam   Triage Vital Signs: ED Triage Vitals [11/03/22 0012]  Encounter Vitals Group     BP 115/89     Systolic BP Percentile      Diastolic BP Percentile      Pulse Rate 83     Resp 16     Temp 98 F (36.7 C)     Temp Source Oral     SpO2 96 %     Weight 257 lb 15 oz (117 kg)     Height 6\' 1"  (1.854 m)     Head Circumference      Peak Flow      Pain Score 0     Pain Loc      Pain Education      Exclude from Growth Chart     Most recent vital signs: Vitals:   11/03/22 0012  BP: 115/89  Pulse: 83  Resp: 16  Temp: 98 F (36.7 C)  SpO2: 96%    General: Awake, no distress.  CV:  Good peripheral perfusion.  Resp:  Normal effort.  Abd:  No distention.  MSK:  No deformity noted.  Neuro:  No focal deficits appreciated. Other:  Diffuse hives not involving palmar surfaces.  To the mucosal surface of the posterior oropharynx does have a couple small vesicles with an erythematous base.  Uvula is midline without signs of PTA   ED Results / Procedures / Treatments   Labs (all labs ordered are listed, but only abnormal results are displayed) Labs Reviewed - No data to display  EKG   RADIOLOGY   Official radiology report(s): No results found.  PROCEDURES and INTERVENTIONS:  Procedures  Medications  diphenhydrAMINE (BENADRYL) capsule 50 mg (has no administration in time  range)     IMPRESSION / MDM / ASSESSMENT AND PLAN / ED COURSE  I reviewed the triage vital signs and the nursing notes.  Differential diagnosis includes, but is not limited to, anaphylaxis, hives, viral syndrome, atopic reaction  Patient presents with diffuse hives suitable for outpatient management with antihistamines.  He does have a couple tiny mucosal lesions to the posterior oropharynx, which makes me think of a possible viral syndrome but no indications for antibiotics.  No signs of PTA.  No sore throat.  Sounds active viral URI.  No signs of anaphylaxis to necessitate an EpiPen.  We discussed antihistamines as well as return precautions.  Patient is suitable for outpatient management      FINAL CLINICAL IMPRESSION(S) / ED DIAGNOSES   Final diagnoses:  Urticaria  Hives  Rash and nonspecific skin eruption     Rx / DC Orders   ED Discharge Orders     None        Note:  This document was prepared using Dragon voice recognition  software and may include unintentional dictation errors.   Delton Prairie, MD 11/03/22 0200

## 2022-11-06 NOTE — Progress Notes (Signed)
Referring Physician:  Pilar Jarvis, MD [address] [place],  [place] [address]  Primary Physician:  [facility]  History of Present Illness: 11/09/2022 [name] is healthy.   Seen in ED on 10/20/22, 10/21/22, and 11/03/22- first two visits were for back and right leg pain. Last visit was for a rash. Was also seen in ED on 09/23/22 for his back.   He has 2-3 year history of constant LBP with right anterior thigh and groin pain that has gotten worse. No left leg pain. Pain is better with changing position. He has numbness, tingling, and weakness in his right leg. The knee has given way.   Given lidoderm patches, naproxen, and prednisone at his first ED visit. Given oxycodone at his second visit. Given valium, mobic, and medrol dose pack at visit on 09/23/22.   He had neurontin in the past and did not tolerate it- made him feel bad.   He uses chewing tobacco daily x 20 years. He occasionally vapes with nicotine.   Bowel/Bladder Dysfunction: none  Conservative measures:  Physical therapy:  denies  Multimodal medical therapy including regular antiinflammatories: lidoderm, naproxen, prednisone, oxycodone, valium, mobic, medrol dose pack, neurontin  Injections:   denies epidural steroid injections  Past Surgery: denies spinal surgery.   Tyler Pita has no symptoms of cervical myelopathy.  The symptoms are causing a significant impact on the patient's life.   Review of Systems:  A 10 point review of systems is negative, except for the pertinent positives and negatives detailed in the HPI.  Past Medical History: No past medical history on file.  Past Surgical History: No past surgical history on file.  Allergies: Allergies as of 11/09/2022   (No Known Allergies)    Medications: Outpatient Encounter Medications as of 11/09/2022  Medication Sig   [DISCONTINUED] acetaminophen (TYLENOL) 500 MG tablet Take 1,000 mg by mouth every 6 (six) hours  as needed.   [DISCONTINUED] diazepam (VALIUM) 2 MG tablet Take 1 tablet (2 mg total) by mouth every 8 (eight) hours as needed (back pain).   [DISCONTINUED] meloxicam (MOBIC) 15 MG tablet Take 1 tablet (15 mg total) by mouth daily.   [DISCONTINUED] methylPREDNISolone (MEDROL DOSEPAK) 4 MG TBPK tablet Take 6 on day 1, 5 on day 2, 4 on day 3, 3 on day 4, 2 on day 5, 1 on day 6   [DISCONTINUED] oxyCODONE (ROXICODONE) 5 MG immediate release tablet Take 1 tablet (5 mg total) by mouth every 8 (eight) hours as needed for up to 12 doses.   [DISCONTINUED] predniSONE (STERAPRED UNI-PAK 21 TAB) 10 MG (21) TBPK tablet Take 6 tablets on day 1 and decrease by 1 tablet for each subsequent day   No facility-administered encounter medications on file as of 11/09/2022.    Social History: Social History   Tobacco Use   Smoking status: Former   Smokeless tobacco: Current    Types: Chew   Tobacco comments:    Chewing tobacco , sometimes vaping  Substance Use Topics   Alcohol use: Yes    Alcohol/week: 3.0 standard drinks of alcohol    Types: 3 Cans of beer per week    Comment: occ   Drug use: No    Family Medical History: No family history on file.  Physical Examination: Vitals:   11/09/22 1002  BP: 130/80    General: Patient is well developed, well nourished, calm, collected, and in no apparent distress. Attention to examination is appropriate.  Respiratory: Patient is breathing without any difficulty.   NEUROLOGICAL:     Awake, alert, oriented to person, place, and time.  Speech is clear and fluent. Fund of knowledge is appropriate.   Cranial Nerves: Pupils equal round and reactive to light.  Facial tone is symmetric.    No posterior lumbar tenderness.   No abnormal lesions on exposed skin.   Strength: Side Biceps Triceps Deltoid Interossei Grip Wrist Ext. Wrist Flex.  R 5 5 5 5 5 5 5   L 5 5 5 5 5 5 5    Side Iliopsoas Quads Hamstring PF DF EHL  R 4- 5 5 5 5 5   L 5 5 5 5 5 5     Reflexes are 2+ and symmetric at the biceps, brachioradialis, patella and achilles.   Hoffman's is absent.  Clonus is not present.   Bilateral upper and lower extremity sensation is intact to light touch, but diminished right thigh.   He has no pain with IR/ER of his right hip.   Gait is slow, slight limp.   Medical Decision Making  Imaging: MRI lumbar spine dated 09/23/22:  FINDINGS: Segmentation: The lowest lumbar type non-rib-bearing vertebra is labeled as L5.   Alignment:  3 mm degenerative retrolisthesis at L3-4.   Vertebrae: Disc desiccation throughout the lumbar spine with loss of disc height at L5-S1.   Type 2 degenerative endplate findings at L5-S1 and L4-5.   Congenitally short pedicles in the lumbar spine.   Conus medullaris and cauda equina: Conus extends to the L1 level. Conus and cauda equina appear normal.   Paraspinal and other soft tissues: Unremarkable   Disc levels:   L1-2: No impingement.  Mild disc bulge   L2-3: Prominent impingement on the right L2 nerve as it approaches the right neural foramen due to a right lateral recess disc extrusion or disc fragment extending cephalad as shown on image 8 series 9 and image 8 series 5. This also contributes to mild right eccentric central narrowing of the thecal sac and mild right subarticular lateral recess stenosis. The right lateral recess ectopic disc material is new compared to the prior exam.   L3-4: Moderate right and borderline left subarticular lateral recess stenosis and mild right foraminal stenosis as well as moderate right eccentric central narrowing of the thecal sac due to disc bulge, right lateral recess disc protrusion, and mild facet arthropathy along with short pedicles. Impingement at this level is reduced from prior given reduction in size of the right eccentric disc protrusion.   L4-5: Moderate right and mild left foraminal stenosis with moderate right subarticular lateral recess  stenosis and moderate right eccentric central narrowing of the thecal sac due to facet arthropathy, ligamentum flavum redundancy, short pedicles, and disc bulge. Right facet encroachment mildly worsened from prior.   L5-S1: Mild left foraminal stenosis due to intervertebral spurring, disc bulge, and facet arthropathy. This is similar to the prior exam. There is also a synovial cyst posterior to the facet joint and not in a position to cause impingement.   IMPRESSION: 1. New right lateral recess disc extrusion or disc fragment extending cephalad from the L2-3 level exerting prominent mass effect on the right L2 nerve. Mild right eccentric central narrowing of the thecal sac and mild right subarticular lateral recess stenosis at this level as well. 2. Lumbar spondylosis, degenerative disc disease, and short pedicles also cause moderate impingement at L3-4 and L4-5, and mild impingement at L5-S1. The impingement at L4-5 is worsened and the  impingement at L3-4 is improved compared to previous lumbar MRI of 07/18/2020.     Electronically Signed   By: Gaylyn Rong M.D.   On: 09/23/2022 20:12  I have personally reviewed the images and agree with the above interpretation.  Assessment and Plan: Mr. Brocks is a pleasant 52 y.o. male has 2-3 year history of constant LBP with right anterior thigh and groin pain that has gotten worse. No left leg pain. He has numbness, tingling, and weakness in his right leg. The right knee has given way.   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 He also has right foramial and lateral recess stenosis at L3-L4. He has foraminal stenosis bilaterally at L4-L5 and on left at L5-S1.   I think most of his symptoms are from the disc at L2-L3. He also has weakness with right iliopsoas.   Treatment options discussed with patient and following plan made:   - Due to weakness in right iliopsoas, I do not recommend conservative  care (PT/injections).  - Follow up made for him to see Dr. Katrinka Blazing to discuss possible surgical options.  - Very limited prescription of percocet given. Reviewed dosing and side effects. He knows to take least amount possible. PMP reviewed and is appropriate.  - Given script for robaxin. Reviewed dosing and side effects. Discussed this can cause drowsiness.  - Refill given for lidoderm patches.   I spent a total of 40 minutes in face-to-face and non-face-to-face activities related to this patient's care today including review of outside records, review of imaging, review of symptoms, physical exam, discussion of differential diagnosis, discussion of treatment options, and documentation.   Thank you for involving me in the care of this patient.   Drake Leach PA-C Dept. of Neurosurgery

## 2022-11-09 ENCOUNTER — Ambulatory Visit: Payer: Medicaid Other | Admitting: Orthopedic Surgery

## 2022-11-09 ENCOUNTER — Encounter: Payer: Self-pay | Admitting: Orthopedic Surgery

## 2022-11-09 VITALS — BP 130/80 | Ht 71.0 in | Wt 263.6 lb

## 2022-11-09 DIAGNOSIS — M4726 Other spondylosis with radiculopathy, lumbar region: Secondary | ICD-10-CM

## 2022-11-09 DIAGNOSIS — M5126 Other intervertebral disc displacement, lumbar region: Secondary | ICD-10-CM

## 2022-11-09 DIAGNOSIS — M48061 Spinal stenosis, lumbar region without neurogenic claudication: Secondary | ICD-10-CM

## 2022-11-09 DIAGNOSIS — M47816 Spondylosis without myelopathy or radiculopathy, lumbar region: Secondary | ICD-10-CM

## 2022-11-09 DIAGNOSIS — M5416 Radiculopathy, lumbar region: Secondary | ICD-10-CM

## 2022-11-09 MED ORDER — LIDOCAINE 5 % EX PTCH
1.0000 | MEDICATED_PATCH | Freq: Two times a day (BID) | CUTANEOUS | 0 refills | Status: DC
Start: 2022-11-09 — End: 2022-12-26

## 2022-11-09 MED ORDER — METHOCARBAMOL 500 MG PO TABS
500.0000 mg | ORAL_TABLET | Freq: Three times a day (TID) | ORAL | 0 refills | Status: DC | PRN
Start: 2022-11-09 — End: 2023-02-13

## 2022-11-09 MED ORDER — OXYCODONE-ACETAMINOPHEN 5-325 MG PO TABS
1.0000 | ORAL_TABLET | Freq: Two times a day (BID) | ORAL | 0 refills | Status: AC | PRN
Start: 2022-11-09 — End: 2022-11-14

## 2022-11-09 NOTE — Patient Instructions (Signed)
It was so nice to see you today. Thank you so much for coming in.    I sent a few percocet to your pharmacy. Take only as needed for severe pain. This can make you constipated and/or sleepy.  I also sent a prescription for methocarbamol to help with muscle spasms. Use only as needed and be careful, this can make you sleepy.   I sent a refill of lidoderm patches as well.   You have a follow up with Dr. Katrinka Blazing on 11/19/22.   Please do not hesitate to call if you have any questions or concerns. You can also message me in MyChart.   Drake Leach PA-C [phone]

## 2022-11-15 NOTE — H&P (View-Only) (Signed)
Referring Physician:  [facility] [address] [place],  [place] [address]  Primary Physician:  [facility]  History of Present Illness: 11/19/2022 [name] is here today with a chief complaint of right lower extremity weakness numbness and pain.  He presents with 6 weeks of right lower extremity weakness numbness and pain.  This has not gotten any better.  He feels like it continues to get worse.  His right lower extremity continues to give out on him.  He states that his pain is approximately 5-10 out of 10 depending on his positioning.  If he changes positions he can find comfort for short periods.  Does get worse with standing or walking.  He gets Ortho extending his leg.  Not having any bowel or bladder dysfunction.  He has tried lidocaine, naproxen, steroid taper, oxycodone, Valium, med [employer].  He has not had any previous spine surgery.  No symptoms of cervical myelopathy.  Review of Systems:  A 10 point review of systems is negative, except for the pertinent positives and negatives detailed in the HPI.  Past Medical History: History reviewed. No pertinent past medical history.  Past Surgical History: History reviewed. No pertinent surgical history.  Allergies: Allergies as of 11/19/2022   (No Known Allergies)    Medications:  Current Outpatient Medications:    lidocaine (LIDODERM) 5 %, Place 1 patch onto the skin every 12 (twelve) hours. Leave patch on for 12 hours, then remove and can reapply in 12 hours., Disp: 30 patch, Rfl: 0   methocarbamol (ROBAXIN) 500 MG tablet, Take 1 tablet (500 mg total) by mouth every 8 (eight) hours as needed for muscle spasms., Disp: 60 tablet, Rfl: 0  Social History: Social History   Tobacco Use   Smoking status: Former   Smokeless tobacco: Current    Types: Chew   Tobacco comments:    Chewing tobacco , sometimes vaping  Substance Use Topics   Alcohol use:  Yes    Alcohol/week: 3.0 standard drinks of alcohol    Types: 3 Cans of beer per week    Comment: occ   Drug use: No    Family Medical History: History reviewed. No pertinent family history.  Physical Examination: Vitals:   11/19/22 1059  BP: 122/82    General: Patient is in no apparent distress. Attention to examination is appropriate.  Neck:   Supple.  Full range of motion.  Respiratory: Patient is breathing without any difficulty.   NEUROLOGICAL:     Awake, alert, oriented to person, place, and time.  Speech is clear and fluent.   Cranial Nerves: Pupils equal round and reactive to light.  Facial tone is symmetric.  Facial sensation is symmetric. Shoulder shrug is symmetric. Tongue protrusion is midline.    Strength:  Side Iliopsoas Adduction Quads Hamstring PF DF EHL  R 2 2 4 5 5 5 5   L 5 5 5 5 5 5 5    Reflexes are 2+ and symmetric at the biceps, triceps, brachioradialis, patella and achilles.  Hoffman's is absent. Clonus is absent  Bilateral upper and lower extremity sensation is intact to light touch with the exception of the right L2 dermatome     No evidence of dysmetria noted.  Gait is normal.    Imaging: Narrative & Impression  CLINICAL DATA:  Low back pain radiating to the right leg   EXAM: MRI LUMBAR SPINE WITHOUT CONTRAST   TECHNIQUE: Multiplanar, multisequence  MR imaging of the lumbar spine was performed. No intravenous contrast was administered.   COMPARISON:  07/18/2020 and lumbar spine CT from 09/21/2022   FINDINGS: Segmentation: The lowest lumbar type non-rib-bearing vertebra is labeled as L5.   Alignment:  3 mm degenerative retrolisthesis at L3-4.   Vertebrae: Disc desiccation throughout the lumbar spine with loss of disc height at L5-S1.   Type 2 degenerative endplate findings at L5-S1 and L4-5.   Congenitally short pedicles in the lumbar spine.   Conus medullaris and cauda equina: Conus extends to the L1 level. Conus and cauda  equina appear normal.   Paraspinal and other soft tissues: Unremarkable   Disc levels:   L1-2: No impingement.  Mild disc bulge   L2-3: Prominent impingement on the right L2 nerve as it approaches the right neural foramen due to a right lateral recess disc extrusion or disc fragment extending cephalad as shown on image 8 series 9 and image 8 series 5. This also contributes to mild right eccentric central narrowing of the thecal sac and mild right subarticular lateral recess stenosis. The right lateral recess ectopic disc material is new compared to the prior exam.   L3-4: Moderate right and borderline left subarticular lateral recess stenosis and mild right foraminal stenosis as well as moderate right eccentric central narrowing of the thecal sac due to disc bulge, right lateral recess disc protrusion, and mild facet arthropathy along with short pedicles. Impingement at this level is reduced from prior given reduction in size of the right eccentric disc protrusion.   L4-5: Moderate right and mild left foraminal stenosis with moderate right subarticular lateral recess stenosis and moderate right eccentric central narrowing of the thecal sac due to facet arthropathy, ligamentum flavum redundancy, short pedicles, and disc bulge. Right facet encroachment mildly worsened from prior.   L5-S1: Mild left foraminal stenosis due to intervertebral spurring, disc bulge, and facet arthropathy. This is similar to the prior exam. There is also a synovial cyst posterior to the facet joint and not in a position to cause impingement.   IMPRESSION: 1. New right lateral recess disc extrusion or disc fragment extending cephalad from the L2-3 level exerting prominent mass effect on the right L2 nerve. Mild right eccentric central narrowing of the thecal sac and mild right subarticular lateral recess stenosis at this level as well. 2. Lumbar spondylosis, degenerative disc disease, and short  pedicles also cause moderate impingement at L3-4 and L4-5, and mild impingement at L5-S1. The impingement at L4-5 is worsened and the impingement at L3-4 is improved compared to previous lumbar MRI of 07/18/2020.     Electronically Signed   By: Gaylyn Rong M.D.   On: 09/23/2022 20:12       I have personally reviewed the images and agree with the above interpretation.  Medical Decision Making/Assessment and Plan: [name] is a pleasant 52 y.o. male with a right-sided lumbar radiculopathy at L2-3.  He presents with 6 weeks of progressive weakness, was seen in our neurosurgery clinic and referred for further evaluation.  Given his weakness which is on the right side specifically in the iliopsoas and thigh adductors urgent referral was made.  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  On physical exam he has severe weakness in his iliopsoas and thigh adductors.  He has continued strength in his dorsiflexion plantarflexion EHL.  He has some pain limited quad weakness on the right.  On the left he  is full strength.  Given his level of weakness which is quite severe and his pain which is debilitating we will plan for a microdiscectomy at L2-3.  Will request urgent authorization given his weakness severity and worsening symptoms.  All the risk and benefits of surgery were discussed.  We discussed nerve injury, CSF leak, back pain, infection, hematoma as well as other possibilities.  Thank you for involving me in the care of this patient.    Lovenia Kim MD/MSCR Neurosurgery

## 2022-11-15 NOTE — Progress Notes (Unsigned)
Referring Physician:  [facility] [address] [place],  [place] [address]  Primary Physician:  [facility]  History of Present Illness: 11/19/2022 Mr. Kiaeem Goold is here today with a chief complaint of right lower extremity weakness numbness and pain.  He presents with 6 weeks of right lower extremity weakness numbness and pain.  This has not gotten any better.  He feels like it continues to get worse.  His right lower extremity continues to give out on him.  He states that his pain is approximately 5-10 out of 10 depending on his positioning.  If he changes positions he can find comfort for short periods.  Does get worse with standing or walking.  He gets Ortho extending his leg.  Not having any bowel or bladder dysfunction.  He has tried lidocaine, naproxen, steroid taper, oxycodone, Valium, med [employer].  He has not had any previous spine surgery.  No symptoms of cervical myelopathy.  Review of Systems:  A 10 point review of systems is negative, except for the pertinent positives and negatives detailed in the HPI.  Past Medical History: History reviewed. No pertinent past medical history.  Past Surgical History: History reviewed. No pertinent surgical history.  Allergies: Allergies as of 11/19/2022   (No Known Allergies)    Medications:  Current Outpatient Medications:    lidocaine (LIDODERM) 5 %, Place 1 patch onto the skin every 12 (twelve) hours. Leave patch on for 12 hours, then remove and can reapply in 12 hours., Disp: 30 patch, Rfl: 0   methocarbamol (ROBAXIN) 500 MG tablet, Take 1 tablet (500 mg total) by mouth every 8 (eight) hours as needed for muscle spasms., Disp: 60 tablet, Rfl: 0  Social History: Social History   Tobacco Use   Smoking status: Former   Smokeless tobacco: Current    Types: Chew   Tobacco comments:    Chewing tobacco , sometimes vaping  Substance Use Topics   Alcohol use:  Yes    Alcohol/week: 3.0 standard drinks of alcohol    Types: 3 Cans of beer per week    Comment: occ   Drug use: No    Family Medical History: History reviewed. No pertinent family history.  Physical Examination: Vitals:   11/19/22 1059  BP: 122/82    General: Patient is in no apparent distress. Attention to examination is appropriate.  Neck:   Supple.  Full range of motion.  Respiratory: Patient is breathing without any difficulty.   NEUROLOGICAL:     Awake, alert, oriented to person, place, and time.  Speech is clear and fluent.   Cranial Nerves: Pupils equal round and reactive to light.  Facial tone is symmetric.  Facial sensation is symmetric. Shoulder shrug is symmetric. Tongue protrusion is midline.    Strength:  Side Iliopsoas Adduction Quads Hamstring PF DF EHL  R 2 2 4 5 5 5 5   L 5 5 5 5 5 5 5    Reflexes are 2+ and symmetric at the biceps, triceps, brachioradialis, patella and achilles.  Hoffman's is absent. Clonus is absent  Bilateral upper and lower extremity sensation is intact to light touch with the exception of the right L2 dermatome     No evidence of dysmetria noted.  Gait is normal.    Imaging: Narrative & Impression  CLINICAL DATA:  Low back pain radiating to the right leg   EXAM: MRI LUMBAR SPINE WITHOUT CONTRAST   TECHNIQUE: Multiplanar, multisequence  MR imaging of the lumbar spine was performed. No intravenous contrast was administered.   COMPARISON:  07/18/2020 and lumbar spine CT from 09/21/2022   FINDINGS: Segmentation: The lowest lumbar type non-rib-bearing vertebra is labeled as L5.   Alignment:  3 mm degenerative retrolisthesis at L3-4.   Vertebrae: Disc desiccation throughout the lumbar spine with loss of disc height at L5-S1.   Type 2 degenerative endplate findings at L5-S1 and L4-5.   Congenitally short pedicles in the lumbar spine.   Conus medullaris and cauda equina: Conus extends to the L1 level. Conus and cauda  equina appear normal.   Paraspinal and other soft tissues: Unremarkable   Disc levels:   L1-2: No impingement.  Mild disc bulge   L2-3: Prominent impingement on the right L2 nerve as it approaches the right neural foramen due to a right lateral recess disc extrusion or disc fragment extending cephalad as shown on image 8 series 9 and image 8 series 5. This also contributes to mild right eccentric central narrowing of the thecal sac and mild right subarticular lateral recess stenosis. The right lateral recess ectopic disc material is new compared to the prior exam.   L3-4: Moderate right and borderline left subarticular lateral recess stenosis and mild right foraminal stenosis as well as moderate right eccentric central narrowing of the thecal sac due to disc bulge, right lateral recess disc protrusion, and mild facet arthropathy along with short pedicles. Impingement at this level is reduced from prior given reduction in size of the right eccentric disc protrusion.   L4-5: Moderate right and mild left foraminal stenosis with moderate right subarticular lateral recess stenosis and moderate right eccentric central narrowing of the thecal sac due to facet arthropathy, ligamentum flavum redundancy, short pedicles, and disc bulge. Right facet encroachment mildly worsened from prior.   L5-S1: Mild left foraminal stenosis due to intervertebral spurring, disc bulge, and facet arthropathy. This is similar to the prior exam. There is also a synovial cyst posterior to the facet joint and not in a position to cause impingement.   IMPRESSION: 1. New right lateral recess disc extrusion or disc fragment extending cephalad from the L2-3 level exerting prominent mass effect on the right L2 nerve. Mild right eccentric central narrowing of the thecal sac and mild right subarticular lateral recess stenosis at this level as well. 2. Lumbar spondylosis, degenerative disc disease, and short  pedicles also cause moderate impingement at L3-4 and L4-5, and mild impingement at L5-S1. The impingement at L4-5 is worsened and the impingement at L3-4 is improved compared to previous lumbar MRI of 07/18/2020.     Electronically Signed   By: Gaylyn Rong M.D.   On: 09/23/2022 20:12       I have personally reviewed the images and agree with the above interpretation.  Medical Decision Making/Assessment and Plan: Mr. Bartolomucci is a pleasant 52 y.o. male with a right-sided lumbar radiculopathy at L2-3.  He presents with 6 weeks of progressive weakness, was seen in our neurosurgery clinic and referred for further evaluation.  Given his weakness which is on the right side specifically in the iliopsoas and thigh adductors urgent referral was made.  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  On physical exam he has severe weakness in his iliopsoas and thigh adductors.  He has continued strength in his dorsiflexion plantarflexion EHL.  He has some pain limited quad weakness on the right.  On the left he  is full strength.  Given his level of weakness which is quite severe and his pain which is debilitating we will plan for a microdiscectomy at L2-3.  Will request urgent authorization given his weakness severity and worsening symptoms.  All the risk and benefits of surgery were discussed.  We discussed nerve injury, CSF leak, back pain, infection, hematoma as well as other possibilities.  Thank you for involving me in the care of this patient.    Lovenia Kim MD/MSCR Neurosurgery

## 2022-11-19 ENCOUNTER — Telehealth: Payer: Self-pay

## 2022-11-19 ENCOUNTER — Encounter: Payer: Self-pay | Admitting: Neurosurgery

## 2022-11-19 ENCOUNTER — Ambulatory Visit (INDEPENDENT_AMBULATORY_CARE_PROVIDER_SITE_OTHER): Payer: Medicaid Other | Admitting: Neurosurgery

## 2022-11-19 ENCOUNTER — Encounter
Admission: RE | Admit: 2022-11-19 | Discharge: 2022-11-19 | Disposition: A | Discharge status: Home / Self Care | Payer: Medicaid Other | Source: Ambulatory Visit | Attending: Neurosurgery | Admitting: Neurosurgery

## 2022-11-19 ENCOUNTER — Other Ambulatory Visit: Payer: Self-pay

## 2022-11-19 VITALS — BP 122/82 | Ht 71.0 in | Wt 272.0 lb

## 2022-11-19 DIAGNOSIS — M5416 Radiculopathy, lumbar region: Secondary | ICD-10-CM | POA: Insufficient documentation

## 2022-11-19 DIAGNOSIS — R29898 Other symptoms and signs involving the musculoskeletal system: Secondary | ICD-10-CM | POA: Diagnosis not present

## 2022-11-19 DIAGNOSIS — Z01812 Encounter for preprocedural laboratory examination: Secondary | ICD-10-CM | POA: Insufficient documentation

## 2022-11-19 DIAGNOSIS — M5126 Other intervertebral disc displacement, lumbar region: Secondary | ICD-10-CM | POA: Insufficient documentation

## 2022-11-19 DIAGNOSIS — E876 Hypokalemia: Secondary | ICD-10-CM | POA: Insufficient documentation

## 2022-11-19 DIAGNOSIS — Z01818 Encounter for other preprocedural examination: Secondary | ICD-10-CM

## 2022-11-19 LAB — BASIC METABOLIC PANEL
Anion gap: 9 (ref 5–15)
BUN: 13 mg/dL (ref 6–20)
CO2: 26 mmol/L (ref 22–32)
Calcium: 8.9 mg/dL (ref 8.9–10.3)
Chloride: 103 mmol/L (ref 98–111)
Creatinine, Ser: 0.71 mg/dL (ref 0.61–1.24)
GFR, Estimated: >60 mL/min (ref >60)
Glucose, Bld: 98 mg/dL (ref 70–99)
Potassium: 3.5 mmol/L (ref 3.5–5.1)
Sodium: 138 mmol/L (ref 135–145)

## 2022-11-19 LAB — URINALYSIS, COMPLETE (UACMP) WITH MICROSCOPIC
Bacteria, UA: NONE SEEN
Bilirubin Urine: NEGATIVE
Glucose, UA: NEGATIVE mg/dL
Ketones, ur: NEGATIVE mg/dL
Leukocytes,Ua: NEGATIVE
Nitrite: NEGATIVE
Protein, ur: NEGATIVE mg/dL
Specific Gravity, Urine: 1.017 (ref 1.005–1.030)
pH: 6 (ref 5.0–8.0)

## 2022-11-19 LAB — SURGICAL PCR SCREEN
MRSA, PCR: NEGATIVE
Staphylococcus aureus: POSITIVE — AB

## 2022-11-19 MED ORDER — CEFAZOLIN IN SODIUM CHLORIDE 2-0.9 GM/100ML-% IV SOLN
3.0000 g | Freq: Once | INTRAVENOUS | Status: DC
  Filled 2022-11-19: qty 200

## 2022-11-19 MED ORDER — ORAL CARE MOUTH RINSE: 15.0000 mL | Freq: Once | OROMUCOSAL | Status: AC

## 2022-11-19 MED ORDER — CHLORHEXIDINE GLUCONATE 0.12 % MT SOLN
15.0000 mL | Freq: Once | OROMUCOSAL | Status: AC
  Administered 2022-11-20: 15 mL via OROMUCOSAL

## 2022-11-19 MED ORDER — LACTATED RINGERS IV SOLN: INTRAVENOUS | Status: DC

## 2022-11-19 NOTE — Telephone Encounter (Signed)
[employer] has approved Mike Herrera's surgery. The OR has offered availability for tomorrow 11/20/22. I spoke with Mike Herrera (with Mike Herrera in the background) around 5:20pm to offer Mike Herrera to move his surgery date up to tomorrow (11/20/22) instead of 11/22/22. They were agreeable to this. They have been informed that he should arrive at 6:45am tomorrow and that he is to be NPO after midnight. He was given CHG soap and instructions in the office today and I have instructed him to use the soap tonight and tomorrow morning.

## 2022-11-19 NOTE — Patient Instructions (Signed)
Please see below for information in regards to your upcoming surgery:   Planned surgery: Right L2-3 microdiscectomy   Surgery date: 11/22/22 at [facility] (Medical Mall: [address], [place], [place] [address]) - you will find out your arrival time the business day before your surgery.   Pre-op appointment at [facility] Pre-admit Testing: we will call you with a date/time for this. If you are scheduled for an in person appointment, Pre-admit Testing is located on the first floor of the [facility] building, [address], [address]. Please bring all prescriptions in the original prescription bottles to your appointment. During this appointment, they will advise you which medications you can take the morning of surgery, and which medications you will need to hold for surgery. Labs (such as blood work, EKG) may be done at your pre-op appointment. You are not required to fast for these labs. Should you need to change your pre-op appointment, please call Pre-admit testing at [phone].     Common restrictions after surgery: No bending, lifting, or twisting ("BLT"). Avoid lifting objects heavier than 10 pounds for the first 6 weeks after surgery. Where possible, avoid household activities that involve lifting, bending, reaching, pushing, or pulling such as laundry, vacuuming, grocery shopping, and childcare. Try to arrange for help from friends and family for these activities while you heal. Do not drive while taking prescription pain medication. Weeks 6 through 12 after surgery: avoid lifting more than 25 pounds.     How to contact us:  If you have any questions/concerns before or after surgery, you can reach [place] at [phone], or you can send a mychart message. We can be reached by phone or mychart 8am-4pm, Monday-Friday.  *Please note: Calls after 4pm are forwarded to a third party answering service. Mychart messages are not routinely monitored during  evenings, weekends, and holidays. Please call our office to contact the answering service for urgent concerns during non-business hours.    If you have FMLA/disability paperwork, please drop it off or fax it to [phone], attention Patty.   Appointments/FMLA & disability paperwork: Patty Nurse: Royston Cowper  Medical assistants: Laurann Montana, & Lyla Son Physician Assistants: Manning Charity & Drake Leach Surgeons: Venetia Night, MD & Ernestine Mcmurray, MD

## 2022-11-20 ENCOUNTER — Encounter: Payer: Self-pay | Admitting: Neurosurgery

## 2022-11-20 ENCOUNTER — Other Ambulatory Visit: Payer: Self-pay

## 2022-11-20 ENCOUNTER — Encounter
Admission: RE | Disposition: A | Discharge status: Home / Self Care | Payer: Self-pay | Source: Ambulatory Visit | Attending: Neurosurgery

## 2022-11-20 ENCOUNTER — Ambulatory Visit
Admission: RE | Admit: 2022-11-20 | Discharge: 2022-11-20 | Disposition: A | Discharge status: Home / Self Care | Payer: Medicaid Other | Source: Ambulatory Visit | Attending: Neurosurgery | Admitting: Neurosurgery

## 2022-11-20 ENCOUNTER — Ambulatory Visit: Payer: Medicaid Other | Admitting: Anesthesiology

## 2022-11-20 ENCOUNTER — Ambulatory Visit: Payer: Medicaid Other

## 2022-11-20 ENCOUNTER — Other Ambulatory Visit: Payer: Medicaid Other

## 2022-11-20 DIAGNOSIS — M5116 Intervertebral disc disorders with radiculopathy, lumbar region: Secondary | ICD-10-CM | POA: Insufficient documentation

## 2022-11-20 DIAGNOSIS — Z01818 Encounter for other preprocedural examination: Secondary | ICD-10-CM

## 2022-11-20 DIAGNOSIS — R29898 Other symptoms and signs involving the musculoskeletal system: Secondary | ICD-10-CM

## 2022-11-20 DIAGNOSIS — M4726 Other spondylosis with radiculopathy, lumbar region: Secondary | ICD-10-CM | POA: Insufficient documentation

## 2022-11-20 DIAGNOSIS — M5416 Radiculopathy, lumbar region: Secondary | ICD-10-CM | POA: Diagnosis not present

## 2022-11-20 DIAGNOSIS — M48061 Spinal stenosis, lumbar region without neurogenic claudication: Secondary | ICD-10-CM | POA: Diagnosis not present

## 2022-11-20 DIAGNOSIS — M5137 Other intervertebral disc degeneration, lumbosacral region: Secondary | ICD-10-CM | POA: Insufficient documentation

## 2022-11-20 DIAGNOSIS — M5126 Other intervertebral disc displacement, lumbar region: Secondary | ICD-10-CM | POA: Diagnosis not present

## 2022-11-20 DIAGNOSIS — F17290 Nicotine dependence, other tobacco product, uncomplicated: Secondary | ICD-10-CM | POA: Insufficient documentation

## 2022-11-20 HISTORY — PX: LUMBAR LAMINECTOMY/DECOMPRESSION MICRODISCECTOMY: SHX5026

## 2022-11-20 HISTORY — DX: Other specified health status: Z78.9

## 2022-11-20 LAB — TYPE AND SCREEN
ABO/RH(D): A NEG
ABO/RH(D): A NEG
Antibody Screen: NEGATIVE
Antibody Screen: NEGATIVE

## 2022-11-20 SURGERY — LUMBAR LAMINECTOMY/DECOMPRESSION MICRODISCECTOMY 1 LEVEL
Anesthesia: General | Laterality: Right

## 2022-11-20 MED ORDER — OXYCODONE HCL 5 MG PO TABS
ORAL_TABLET | ORAL | Status: AC
  Filled 2022-11-20: qty 1

## 2022-11-20 MED ORDER — BUPIVACAINE LIPOSOME 1.3 % IJ SUSP
INTRAMUSCULAR | Status: AC
  Filled 2022-11-20: qty 20

## 2022-11-20 MED ORDER — OXYCODONE HCL 5 MG/5ML PO SOLN: 5.0000 mg | Freq: Once | ORAL | Status: AC | PRN

## 2022-11-20 MED ORDER — SUCCINYLCHOLINE CHLORIDE 200 MG/10ML IV SOSY
PREFILLED_SYRINGE | INTRAVENOUS | Status: AC
  Filled 2022-11-20: qty 10

## 2022-11-20 MED ORDER — CEFAZOLIN IN SODIUM CHLORIDE 3-0.9 GM/100ML-% IV SOLN
3.0000 g | INTRAVENOUS | Status: AC
  Administered 2022-11-20: 3 g via INTRAVENOUS
  Filled 2022-11-20: qty 100

## 2022-11-20 MED ORDER — LIDOCAINE HCL (PF) 2 % IJ SOLN
INTRAMUSCULAR | Status: AC
  Filled 2022-11-20: qty 5

## 2022-11-20 MED ORDER — METHYLPREDNISOLONE ACETATE 40 MG/ML IJ SUSP
INTRAMUSCULAR | Status: DC | PRN
  Administered 2022-11-20: 40 mg

## 2022-11-20 MED ORDER — ACETAMINOPHEN 10 MG/ML IV SOLN
INTRAVENOUS | Status: DC | PRN
  Administered 2022-11-20: 1000 mg via INTRAVENOUS

## 2022-11-20 MED ORDER — DEXAMETHASONE SODIUM PHOSPHATE 10 MG/ML IJ SOLN
INTRAMUSCULAR | Status: AC
  Filled 2022-11-20: qty 1

## 2022-11-20 MED ORDER — SURGIFLO WITH THROMBIN (HEMOSTATIC MATRIX KIT) OPTIME
TOPICAL | Status: DC | PRN
Start: 2022-11-20 — End: 2022-11-20
  Administered 2022-11-20: 2 via TOPICAL

## 2022-11-20 MED ORDER — KETAMINE HCL 10 MG/ML IJ SOLN
INTRAMUSCULAR | Status: DC | PRN
Start: 2022-11-20 — End: 2022-11-20
  Administered 2022-11-20: 50 mg via INTRAVENOUS

## 2022-11-20 MED ORDER — ONDANSETRON HCL 4 MG/2ML IJ SOLN
INTRAMUSCULAR | Status: DC | PRN
  Administered 2022-11-20: 4 mg via INTRAVENOUS

## 2022-11-20 MED ORDER — SUGAMMADEX SODIUM 500 MG/5ML IV SOLN
INTRAVENOUS | Status: DC | PRN
  Administered 2022-11-20: 400 mg via INTRAVENOUS

## 2022-11-20 MED ORDER — HYDROMORPHONE HCL 1 MG/ML IJ SOLN
INTRAMUSCULAR | Status: DC | PRN
Start: 2022-11-20 — End: 2022-11-20
  Administered 2022-11-20: 1 mg via INTRAVENOUS

## 2022-11-20 MED ORDER — 0.9 % SODIUM CHLORIDE (POUR BTL) OPTIME
TOPICAL | Status: DC | PRN
  Administered 2022-11-20: 150 mL

## 2022-11-20 MED ORDER — PHENYLEPHRINE 80 MCG/ML (10ML) SYRINGE FOR IV PUSH (FOR BLOOD PRESSURE SUPPORT)
PREFILLED_SYRINGE | INTRAVENOUS | Status: DC | PRN
  Administered 2022-11-20 (×4): 160 ug via INTRAVENOUS

## 2022-11-20 MED ORDER — METHYLPREDNISOLONE ACETATE 40 MG/ML IJ SUSP
INTRAMUSCULAR | Status: AC
  Filled 2022-11-20: qty 1

## 2022-11-20 MED ORDER — PROPOFOL 10 MG/ML IV BOLUS
INTRAVENOUS | Status: DC | PRN
  Administered 2022-11-20: 200 mg via INTRAVENOUS

## 2022-11-20 MED ORDER — HYDROMORPHONE HCL 1 MG/ML IJ SOLN: 0.2500 mg | INTRAMUSCULAR | Status: DC | PRN

## 2022-11-20 MED ORDER — CHLORHEXIDINE GLUCONATE 0.12 % MT SOLN
OROMUCOSAL | Status: AC
  Filled 2022-11-20: qty 15

## 2022-11-20 MED ORDER — BUPIVACAINE HCL (PF) 0.5 % IJ SOLN
INTRAMUSCULAR | Status: AC
  Filled 2022-11-20: qty 30

## 2022-11-20 MED ORDER — PHENYLEPHRINE HCL-NACL 20-0.9 MG/250ML-% IV SOLN
INTRAVENOUS | Status: DC | PRN
Start: 2022-11-20 — End: 2022-11-20
  Administered 2022-11-20: 50 ug/min via INTRAVENOUS

## 2022-11-20 MED ORDER — OXYCODONE HCL 5 MG PO TABS
5.0000 mg | ORAL_TABLET | Freq: Once | ORAL | Status: AC | PRN
  Administered 2022-11-20: 5 mg via ORAL

## 2022-11-20 MED ORDER — ROCURONIUM BROMIDE 10 MG/ML (PF) SYRINGE
PREFILLED_SYRINGE | INTRAVENOUS | Status: AC
  Filled 2022-11-20: qty 10

## 2022-11-20 MED ORDER — DEXMEDETOMIDINE HCL IN NACL 80 MCG/20ML IV SOLN
INTRAVENOUS | Status: DC | PRN
Start: 2022-11-20 — End: 2022-11-20
  Administered 2022-11-20: 12 ug via INTRAVENOUS
  Administered 2022-11-20: 8 ug via INTRAVENOUS

## 2022-11-20 MED ORDER — SODIUM CHLORIDE FLUSH 0.9 % IV SOLN
INTRAVENOUS | Status: AC
  Filled 2022-11-20: qty 20

## 2022-11-20 MED ORDER — VASOPRESSIN 20 UNIT/ML IV SOLN
INTRAVENOUS | Status: DC | PRN
Start: 2022-11-20 — End: 2022-11-20
  Administered 2022-11-20 (×3): 2 [IU] via INTRAVENOUS

## 2022-11-20 MED ORDER — MIDAZOLAM HCL 2 MG/2ML IJ SOLN
INTRAMUSCULAR | Status: AC
  Filled 2022-11-20: qty 2

## 2022-11-20 MED ORDER — ONDANSETRON HCL 4 MG/2ML IJ SOLN
INTRAMUSCULAR | Status: AC
  Filled 2022-11-20: qty 2

## 2022-11-20 MED ORDER — ACETAMINOPHEN 10 MG/ML IV SOLN
INTRAVENOUS | Status: AC
  Filled 2022-11-20: qty 100

## 2022-11-20 MED ORDER — MIDAZOLAM HCL 2 MG/2ML IJ SOLN
INTRAMUSCULAR | Status: DC | PRN
  Administered 2022-11-20: 2 mg via INTRAVENOUS

## 2022-11-20 MED ORDER — DEXAMETHASONE SODIUM PHOSPHATE 10 MG/ML IJ SOLN
INTRAMUSCULAR | Status: DC | PRN
  Administered 2022-11-20: 10 mg via INTRAVENOUS

## 2022-11-20 MED ORDER — HYDROMORPHONE HCL 1 MG/ML IJ SOLN
INTRAMUSCULAR | Status: AC
  Filled 2022-11-20: qty 1

## 2022-11-20 MED ORDER — SUCCINYLCHOLINE CHLORIDE 200 MG/10ML IV SOSY
PREFILLED_SYRINGE | INTRAVENOUS | Status: DC | PRN
  Administered 2022-11-20: 140 mg via INTRAVENOUS

## 2022-11-20 MED ORDER — SODIUM CHLORIDE (PF) 0.9 % IJ SOLN
INTRAMUSCULAR | Status: DC | PRN
  Administered 2022-11-20: 30 mL

## 2022-11-20 MED ORDER — BUPIVACAINE-EPINEPHRINE (PF) 0.5% -1:200000 IJ SOLN
INTRAMUSCULAR | Status: DC | PRN
  Administered 2022-11-20: 10 mL via PERINEURAL

## 2022-11-20 MED ORDER — PHENYLEPHRINE HCL-NACL 20-0.9 MG/250ML-% IV SOLN
INTRAVENOUS | Status: AC
  Filled 2022-11-20: qty 250

## 2022-11-20 MED ORDER — OXYCODONE HCL 5 MG PO TABS
5.0000 mg | ORAL_TABLET | ORAL | 0 refills | Status: DC | PRN
Start: 2022-11-20 — End: 2022-12-03

## 2022-11-20 MED ORDER — ROCURONIUM BROMIDE 100 MG/10ML IV SOLN
INTRAVENOUS | Status: DC | PRN
  Administered 2022-11-20: 50 mg via INTRAVENOUS

## 2022-11-20 MED ORDER — PROPOFOL 10 MG/ML IV BOLUS
INTRAVENOUS | Status: AC
  Filled 2022-11-20: qty 40

## 2022-11-20 MED ORDER — KETAMINE HCL 50 MG/5ML IJ SOSY
PREFILLED_SYRINGE | INTRAMUSCULAR | Status: AC
  Filled 2022-11-20: qty 5

## 2022-11-20 MED ORDER — LIDOCAINE HCL (CARDIAC) PF 100 MG/5ML IV SOSY
PREFILLED_SYRINGE | INTRAVENOUS | Status: DC | PRN
  Administered 2022-11-20: 100 mg via INTRAVENOUS

## 2022-11-20 SURGICAL SUPPLY — 34 items
ADH SKN CLS APL DERMABOND .7 (GAUZE/BANDAGES/DRESSINGS) ×1
AGENT HMST KT MTR STRL THRMB (HEMOSTASIS) ×2
BASIN KIT SINGLE STR (MISCELLANEOUS) ×1 IMPLANT
BRUSH SCRUB EZ 4% CHG (MISCELLANEOUS) ×1 IMPLANT
BUR NEURO DRILL SOFT 3.0X3.8M (BURR) ×1 IMPLANT
DERMABOND ADVANCED .7 DNX12 (GAUZE/BANDAGES/DRESSINGS) ×1 IMPLANT
DRAPE LAPAROTOMY 100X77 ABD (DRAPES) ×1 IMPLANT
DRAPE MICROSCOPE SPINE 48X150 (DRAPES) ×1 IMPLANT
DRSG OPSITE POSTOP 3X4 (GAUZE/BANDAGES/DRESSINGS) ×1 IMPLANT
DRSG TEGADERM 4X4.75 (GAUZE/BANDAGES/DRESSINGS) ×1 IMPLANT
ELECT EZSTD 165MM 6.5IN (MISCELLANEOUS) ×1
ELECTRODE EZSTD 165MM 6.5IN (MISCELLANEOUS) ×1 IMPLANT
GLOVE BIOGEL PI IND STRL 8 (GLOVE) ×1 IMPLANT
GLOVE SURG SYN 7.5 E (GLOVE) ×1 IMPLANT
GLOVE SURG SYN 7.5 PF PI (GLOVE) ×1 IMPLANT
GOWN STRL REUS W/ TWL XL LVL3 (GOWN DISPOSABLE) ×1 IMPLANT
GOWN STRL REUS W/TWL XL LVL3 (GOWN DISPOSABLE) ×1
KIT WILSON FRAME (KITS) ×1 IMPLANT
KNIFE BAYONET SHORT DISCETOMY (MISCELLANEOUS) IMPLANT
NS IRRIG 1000ML POUR BTL (IV SOLUTION) ×1 IMPLANT
PACK LAMINECTOMY ARMC (PACKS) ×1 IMPLANT
PAD ARMBOARD 7.5X6 YLW CONV (MISCELLANEOUS) ×2 IMPLANT
SURGIFLO W/THROMBIN 8M KIT (HEMOSTASIS) ×1 IMPLANT
SUT MNCRL 4-0 (SUTURE) ×1
SUT MNCRL 4-0 27XMFL (SUTURE) ×1
SUT VIC AB 0 CT1 27 (SUTURE) ×1
SUT VIC AB 0 CT1 27XCR 8 STRN (SUTURE) ×1 IMPLANT
SUT VIC AB 2-0 CT1 18 (SUTURE) ×1 IMPLANT
SUTURE MNCRL 4-0 27XMF (SUTURE) ×1 IMPLANT
SYR 10ML LL (SYRINGE) ×1 IMPLANT
SYR 30ML LL (SYRINGE) ×1 IMPLANT
SYR 3ML LL SCALE MARK (SYRINGE) ×1 IMPLANT
TRAP FLUID SMOKE EVACUATOR (MISCELLANEOUS) ×1 IMPLANT
WATER STERILE IRR 1000ML POUR (IV SOLUTION) ×1 IMPLANT

## 2022-11-20 NOTE — Anesthesia Preprocedure Evaluation (Signed)
Anesthesia Evaluation  Patient identified by MRN, date of birth, ID band Patient awake    Reviewed: Allergy & Precautions, NPO status , Patient's Chart, lab work & pertinent test results  History of Anesthesia Complications Negative for: history of anesthetic complications  Airway Mallampati: II  TM Distance: >3 FB Neck ROM: full    Dental no notable dental hx.    Pulmonary neg pulmonary ROS, former smoker   Pulmonary exam normal        Cardiovascular negative cardio ROS Normal cardiovascular exam     Neuro/Psych  Neuromuscular disease  negative psych ROS   GI/Hepatic negative GI ROS, Neg liver ROS,,,  Endo/Other  negative endocrine ROS    Renal/GU      Musculoskeletal   Abdominal   Peds  Hematology negative hematology ROS (+)   Anesthesia Other Findings Past Medical History: No date: Medical history non-contributory  Past Surgical History: No date: NO PAST SURGERIES     Reproductive/Obstetrics negative OB ROS                             Anesthesia Physical Anesthesia Plan  ASA: 2  Anesthesia Plan: General ETT   Post-op Pain Management: Toradol IV (intra-op)*, Ofirmev IV (intra-op)* and Ketamine IV*   Induction: Intravenous  PONV Risk Score and Plan: 2 and Ondansetron, Dexamethasone, Midazolam and Treatment may vary due to age or medical condition  Airway Management Planned: Oral ETT  Additional Equipment:   Intra-op Plan:   Post-operative Plan: Extubation in OR  Informed Consent: I have reviewed the patients History and Physical, chart, labs and discussed the procedure including the risks, benefits and alternatives for the proposed anesthesia with the patient or authorized representative who has indicated his/her understanding and acceptance.     Dental Advisory Given  Plan Discussed with: Anesthesiologist, CRNA and Surgeon  Anesthesia Plan Comments: (Patient  consented for risks of anesthesia including but not limited to:  - adverse reactions to medications - damage to eyes, teeth, lips or other oral mucosa - nerve damage due to positioning  - sore throat or hoarseness - Damage to heart, brain, nerves, lungs, other parts of body or loss of life  Patient voiced understanding.)       Anesthesia Quick Evaluation

## 2022-11-20 NOTE — Op Note (Signed)
Indications: [name] is suffering from lumbar radiculopathy.  He was found to have a disc herniation at L2-3 causing compression of his L2 and 3 nerve root.  Given the severity of his weakness we planned for microdiscectomy  Findings: Extruded disc fragment causing compression of the L2 nerve root  Preoperative Diagnosis: Lumbar radiculopathy (ICD-10 M54.16) Postoperative Diagnosis: same   EBL: 20 cc IVF: see anesthesia record Drains: none Disposition: Extubated and Stable to PACU Complications: none  No foley catheter was placed.   Preoperative Note: Patient was seen in the preoperative area.  He continued to have severe lumbar radiculopathy with weakness in his hip flexion and significant pain and numbness in his L2 distribution.  Given his weakness we plan for microdiscectomy and lateral recess decompression  Risks of surgery discussed include: infection, bleeding, stroke, coma, death, paralysis, CSF leak, nerve/spinal cord injury, numbness, tingling, weakness, complex regional pain syndrome, recurrent stenosis and/or disc herniation, vascular injury, development of instability, neck/back pain, need for further surgery, persistent symptoms, development of deformity, and the risks of anesthesia. The patient understood these risks and agreed to proceed.  Operative Note:   1) Right L2/3 microdiscectomy  The patient was then brought from the preoperative center with intravenous access established.  The patient underwent general anesthesia and endotracheal tube intubation, and was then rotated on the [place] rail top where all pressure points were appropriately padded.  The skin was then thoroughly cleansed.  Perioperative antibiotic prophylaxis was administered.  Sterile prep and drapes were then applied and a timeout was then observed.  C-arm was brought into the field under sterile conditions, and the L2-3 disc space identified and marked with an incision on the right 1cm lateral to  midline.  Once this was complete a 2 cm incision was opened with the use of a #10 blade knife.  The Metrx tubes were sequentially advanced under lateral fluoroscopy until a 18 x 70 mm Metrx tube was placed over the facet and lamina and secured to the bed.    The microscope was then sterilely brought into the field and muscle creep was hemostased with a bipolar and resected with a pituitary rongeur.  A Bovie extender was then used to expose the spinous process and lamina.  Careful attention was placed to not violate the facet capsule. A 3 mm matchstick drill bit was then used to make a hemi-laminotomy trough until the ligamentum flavum was exposed.  This was extended to the base of the spinous process.  Once this was complete and the underlying ligamentum flavum was visualized, the ligamentum was dissected with an up angle curette and resected with a #2 and #3 mm biting Kerrison.  The laminotomy opening was also expanded in similar fashion and hemostasis was obtained with Surgifoam and a patty as well as bone wax.  The rostral aspect of the caudal level of the lamina was also resected with a #2 biting Kerrison effort to further enhance exposure.  Once the underlying dura was visualized a Penfield 4 was then used to dissect and expose the traversing nerve root.  Once this was identified a nerve root retractor suction was used to mobilize this medially.  We extended more cranial than usual to help access the dorsum of the L2 body in addition to the disc space.    The venous plexus was hemostased with Surgifoam and light bipolar use.  A small penfied was then used to make a small annulotomy within the disc space and disc space contents  were noted to come through the annulus.  We continue to follow this up to the posterior L2 body and were able to remove extruded disc fragments.  These came out piecemeal.  The nerve and lateral aspect of the dura was well decompressed at the end of the procedure.  The disc  herniation was identified and dissected free using a balltip probe. The pituitary rongeur was used to remove the extruded disc fragments. Once the thecal sac and nerve root were noted to be relaxed and under less tension the ball-tipped feeler was passed along the foramen distally to ensure no residual compression was noted.    Depo-Medrol was placed along the nerve root.  The area was irrigated. The tube system was then removed under microscopic visualization and hemostasis was obtained with a bipolar.    The fascial layer was reapproximated with the use of a 0- Vicryl suture.  Subcutaneous tissue layer was reapproximated using 2-0 Vicryl suture. The skin was then cleansed and Dermabond was used to close the skin opening.  Patient was then rotated back to the preoperative bed awakened from anesthesia and taken to recovery all counts are correct in this case.   I performed this procedure in entirety without the use of a surgical assistant.  Lovenia Kim, MD

## 2022-11-20 NOTE — Anesthesia Postprocedure Evaluation (Signed)
Anesthesia Post Note  Patient: Mike Herrera  Procedure(s) Performed: RIGHT L2-3 MICRODISCECTOMY (Right)  Patient location during evaluation: PACU Anesthesia Type: General Level of consciousness: awake and alert Pain management: pain level controlled Vital Signs Assessment: post-procedure vital signs reviewed and stable Respiratory status: spontaneous breathing, nonlabored ventilation, respiratory function stable and patient connected to nasal cannula oxygen Cardiovascular status: blood pressure returned to baseline and stable Postop Assessment: no apparent nausea or vomiting Anesthetic complications: no   No notable events documented.   Last Vitals:  Vitals:   11/20/22 1100 11/20/22 1115  BP: 93/62 107/71  Pulse: 77 100  Resp: 13 12  Temp:    SpO2: 98% 100%    Last Pain:  Vitals:   11/20/22 1115  TempSrc:   PainSc: 0-No pain                 Louie Boston

## 2022-11-20 NOTE — Transfer of Care (Signed)
Immediate Anesthesia Transfer of Care Note  Patient: Mike Herrera  Procedure(s) Performed: RIGHT L2-3 MICRODISCECTOMY (Right)  Patient Location: PACU  Anesthesia Type:General  Level of Consciousness: drowsy  Airway & Oxygen Therapy: Patient Spontanous Breathing and Patient connected to face mask oxygen  Post-op Assessment: Report given to RN, Post -op Vital signs reviewed and stable, and Patient moving all extremities  Post vital signs: Reviewed and stable  Last Vitals:  Vitals Value Taken Time  BP 104/66 11/20/22 1051  Temp 36.5 C 11/20/22 1050  Pulse 78 11/20/22 1057  Resp 12 11/20/22 1057  SpO2 96 % 11/20/22 1057  Vitals shown include unfiled device data.  Last Pain:  Vitals:   11/20/22 1050  TempSrc:   PainSc: Asleep         Complications: No notable events documented.

## 2022-11-20 NOTE — Discharge Instructions (Addendum)
Your surgeon has performed an operation on your lumbar spine (low back) to relieve pressure on one or more nerves. Many times, patients feel better immediately after surgery and can "overdo it." Even if you feel well, it is important that you follow these activity guidelines. If you do not let your back heal properly from the surgery, you can increase the chance of a disc herniation and/or return of your symptoms. The following are instructions to help in your recovery once you have been discharged from the hospital.  * It is ok to take NSAIDs after surgery.  Activity    No bending, lifting, or twisting ("BLT"). Avoid lifting objects heavier than 10 pounds (gallon milk jug).  Where possible, avoid household activities that involve lifting, bending, pushing, or pulling such as laundry, vacuuming, grocery shopping, and childcare. Try to arrange for help from friends and family for these activities while your back heals.  Increase physical activity slowly as tolerated.  Taking short walks is encouraged, but avoid strenuous exercise. Do not jog, run, bicycle, lift weights, or participate in any other exercises unless specifically allowed by your doctor. Avoid prolonged sitting, including car rides.  Talk to your doctor before resuming sexual activity.  You should not drive until cleared by your doctor.  Until released by your doctor, you should not return to work or school.  You should rest at home and let your body heal.   You may shower three days after your surgery.  After showering, lightly dab your incision dry. Do not take a tub bath or go swimming for 3 weeks, or until approved by your doctor at your follow-up appointment.  If you smoke, we strongly recommend that you quit.  Smoking has been proven to interfere with normal healing in your back and will dramatically reduce the success rate of your surgery. Please contact [facility] (800-QUIT-NOW) and use the resources at www.[facility].com for  assistance in stopping smoking.  Surgical Incision   If you have a dressing on your incision, you may remove it three days after your surgery. Keep your incision area clean and dry.  If you have staples or stitches on your incision, you should have a follow up scheduled for removal. If you do not have staples or stitches, you will have steri-strips (small pieces of surgical tape) or Dermabond glue. The steri-strips/glue should begin to peel away within about a week (it is fine if the steri-strips fall off before then). If the strips are still in place one week after your surgery, you may gently remove them.  Diet            You may return to your usual diet. Be sure to stay hydrated.  When to Contact us  Although your surgery and recovery will likely be uneventful, you may have some residual numbness, aches, and pains in your back and/or legs. This is normal and should improve in the next few weeks.  However, should you experience any of the following, contact us immediately: New numbness or weakness Pain that is progressively getting worse, and is not relieved by your pain medications or rest Bleeding, redness, swelling, pain, or drainage from surgical incision Chills or flu-like symptoms Fever greater than 101.0 F (38.3 C) Problems with bowel or bladder functions Difficulty breathing or shortness of breath Warmth, tenderness, or swelling in your calf  Contact Information How to contact us:  If you have any questions/concerns before or after surgery, you can reach [place] at [phone], or you can  send a [employer]. We can be reached by phone or mychart 8am-4pm, Monday-Friday.  *Please note: Calls after 4pm are forwarded to a third party answering service. Mychart messages are not routinely monitored during evenings, weekends, and holidays. Please call our office to contact the answering service for urgent concerns during non-business hours.      AMBULATORY SURGERY  DISCHARGE  INSTRUCTIONS   The drugs that you were given will stay in your system until tomorrow so for the next 24 hours you should not:  Drive an automobile Make any legal decisions Drink any alcoholic beverage   You may resume regular meals tomorrow.  Today it is better to start with liquids and gradually work up to solid foods.  You may eat anything you prefer, but it is better to start with liquids, then soup and crackers, and gradually work up to solid foods.   Please notify your doctor immediately if you have any unusual bleeding, trouble breathing, redness and pain at the surgery site, drainage, fever, or pain not relieved by medication.   Additional Instructions:   leave green armband on for 4 days

## 2022-11-20 NOTE — Anesthesia Procedure Notes (Signed)
Procedure Name: Intubation Date/Time: 11/20/2022 8:57 AM  Performed by: Katherine Basset, CRNAPre-anesthesia Checklist: Patient identified, Emergency Drugs available, Suction available and Patient being monitored Patient Re-evaluated:Patient Re-evaluated prior to induction Oxygen Delivery Method: Circle system utilized Preoxygenation: Pre-oxygenation with 100% oxygen Induction Type: IV induction Ventilation: Mask ventilation without difficulty Laryngoscope Size: Miller and 2 Grade View: Grade I Tube type: Oral Tube size: 7.5 mm Number of attempts: 1 Airway Equipment and Method: Stylet, Oral airway and Bite block Placement Confirmation: ETT inserted through vocal cords under direct vision, positive ETCO2 and breath sounds checked- equal and bilateral Secured at: 23 cm Tube secured with: Tape Dental Injury: Teeth and Oropharynx as per pre-operative assessment

## 2022-11-20 NOTE — Interval H&P Note (Signed)
History and Physical Interval Note:  11/20/2022 8:30 AM  Mike Herrera  has presented today for surgery, with the diagnosis of HNP herniated nucleus pulposus, lumbar  M51.26  Lumbar radiculopathy M54.16  Weakness of right leg R29.898.  The various methods of treatment have been discussed with the patient and family. After consideration of risks, benefits and other options for treatment, the patient has consented to  Procedure(s): RIGHT L2-3 MICRODISCECTOMY (Right) as a surgical intervention.  The patient's history has been reviewed, patient examined, no change in status, stable for surgery.  I have reviewed the patient's chart and labs.  Questions were answered to the patient's satisfaction.     Lovenia Kim

## 2022-11-21 ENCOUNTER — Encounter: Payer: Self-pay | Admitting: Neurosurgery

## 2022-11-21 ENCOUNTER — Other Ambulatory Visit: Payer: Medicaid Other

## 2022-11-28 ENCOUNTER — Telehealth: Payer: Self-pay | Admitting: Neurosurgery

## 2022-11-28 NOTE — Telephone Encounter (Signed)
I spoke with the patient.  See telephone encounter.

## 2022-11-28 NOTE — Telephone Encounter (Signed)
Right L2-3 microdiscectomy on 11/20/22  Patient's wife is calling. Patient is in the background, has only had one BM since he had surgery. He has stomach discomfort. He is taking liquid laxatives. He thinks he has a hemorrhoid now since he has noticed some blood. They would like to talk to the nurse to see if there is anything else he can try.

## 2022-11-28 NOTE — Telephone Encounter (Signed)
I spoke with Mike Herrera. He confirms he has had 2 bowel movements since surgery. The last bowel movement was 2 days ago and was "like popcorn" and there was bright red blood when he wiped. He reports that he feels like he has a hemorrhoid. He is passing gas. I advised him to try magnesium citrate. I also advised him to try make sure he is walking as much as tolerated, increase his fluid intake, and to resume taking a stool softener while he is taking pain medicine until his bowel movements return to normal.  We discussed red flags that warrant evaluation in the emergency room such as black/tarry stools, a hard/rigid abdomen, vomiting, inability to pass gas, or lack of a bowel movement for several days.

## 2022-11-30 NOTE — Progress Notes (Unsigned)
   REFERRING PHYSICIAN:  Center, Charles Kenard Gower [facility] [address] [place],  [place] [address]  DOS: 11/20/22  Right L2-L3 microdiscectomy  HISTORY OF PRESENT ILLNESS: Mike Herrera is approximately 2 weeks status post above surgery. Was given oxycodone on discharge from the hospital.   He has some soreness in his lower back. No leg pain. He still notes some weakness in right leg and thinks it is improving.   Had episode of blood on TP when he wiped last week. He has history of hemorrhoids and this has happened in the past. No bowel/bladder issues.   PHYSICAL EXAMINATION:  General: Patient is well developed, well nourished, calm, collected, and in no apparent distress.   NEUROLOGICAL:  General: In no acute distress.   Awake, alert, oriented to person, place, and time.  Pupils equal round and reactive to light.  Facial tone is symmetric.     Strength:          Side Iliopsoas Quads Hamstring PF DF EHL  R 4 5 5 5 5 5   L 5 5 5 5 5 5    Incision c/d/i   ROS (Neurologic):  Negative except as noted above  IMAGING: Nothing new to review.   ASSESSMENT/PLAN:  Mike Herrera is doing well s/p above surgery. He has some improvement in right leg weakness.  Treatment options reviewed with patient and following plan made:   - I have advised the patient to lift up to 10 pounds until 6 weeks after surgery (follow up with Dr. Katrinka Blazing).  - Reviewed wound care.  - No bending, twisting, or lifting.  - Continue on current medications including prn oxycodone. Refill given. PMP reviewed and is appropriate.  - Continue on prn robaxin.  - Follow up with PCP if he has any other issues with bleeding when he wipes after BM.  - Follow up as scheduled in 4 weeks and prn.   Advised to contact the office if any questions or concerns arise.  Drake Leach PA-C Department of neurosurgery

## 2022-12-03 ENCOUNTER — Encounter: Payer: Self-pay | Admitting: Orthopedic Surgery

## 2022-12-03 ENCOUNTER — Ambulatory Visit (INDEPENDENT_AMBULATORY_CARE_PROVIDER_SITE_OTHER): Payer: Medicaid Other | Admitting: Orthopedic Surgery

## 2022-12-03 VITALS — BP 136/74 | Temp 98.3°F | Ht 71.0 in | Wt 263.0 lb

## 2022-12-03 DIAGNOSIS — M5416 Radiculopathy, lumbar region: Secondary | ICD-10-CM

## 2022-12-03 DIAGNOSIS — Z09 Encounter for follow-up examination after completed treatment for conditions other than malignant neoplasm: Secondary | ICD-10-CM

## 2022-12-03 DIAGNOSIS — M5126 Other intervertebral disc displacement, lumbar region: Secondary | ICD-10-CM

## 2022-12-03 DIAGNOSIS — Z9889 Other specified postprocedural states: Secondary | ICD-10-CM

## 2022-12-03 MED ORDER — OXYCODONE HCL 5 MG PO TABS
5.0000 mg | ORAL_TABLET | Freq: Three times a day (TID) | ORAL | 0 refills | Status: DC | PRN
Start: 2022-12-03 — End: 2022-12-26

## 2022-12-07 ENCOUNTER — Encounter: Payer: Medicaid Other | Admitting: Orthopedic Surgery

## 2022-12-26 ENCOUNTER — Ambulatory Visit: Payer: Medicaid Other | Admitting: Neurosurgery

## 2022-12-26 ENCOUNTER — Encounter: Payer: Self-pay | Admitting: Neurosurgery

## 2022-12-26 VITALS — BP 110/80 | Temp 98.3°F | Ht 71.0 in | Wt 263.0 lb

## 2022-12-26 DIAGNOSIS — M5416 Radiculopathy, lumbar region: Secondary | ICD-10-CM

## 2022-12-26 DIAGNOSIS — Z09 Encounter for follow-up examination after completed treatment for conditions other than malignant neoplasm: Secondary | ICD-10-CM

## 2022-12-26 DIAGNOSIS — R29898 Other symptoms and signs involving the musculoskeletal system: Secondary | ICD-10-CM

## 2022-12-26 NOTE — Progress Notes (Signed)
   REFERRING PHYSICIAN:  Center, Charles Kenard Gower [facility] [address] [place],  [place] [address]  DOS: 11/20/22  Right L2-L3 microdiscectomy  HISTORY OF PRESENT ILLNESS: Mike Herrera is approximately 6 weeks status post above surgery. Was given oxycodone on discharge from the hospital.  No longer taking pain medication.  States that his right lower extremity has improved significantly.  States that his strength is improved and his leg pain has improved as well.  Overall he is happy with his recovery.  His incision is healed up well.  He has not had any physical therapy at this point.  He is interested.  PHYSICAL EXAMINATION:  General: Patient is well developed, well nourished, calm, collected, and in no apparent distress.   NEUROLOGICAL:  General: In no acute distress.   Awake, alert, oriented to person, place, and time.  Pupils equal round and reactive to light.  Facial tone is symmetric.     Strength:          Side Iliopsoas Quads Hamstring PF DF EHL  R 4+ 5 5 5 5 5   L 5 5 5 5 5 5    Incision c/d/i   ROS (Neurologic):  Negative except as noted above  IMAGING: Nothing new to review.   ASSESSMENT/PLAN:  Mike Herrera is doing well s/p above surgery. He has some improvement in right leg weakness.  Treatment options reviewed with patient and following plan made:   -We like to have him start working with physical therapy as he is regaining some of his strength in his right lower extremity. -Overall he is recovering very well from surgery.  Will plan to see him back at his 5-month appointment.  Advised to contact the office if any questions or concerns arise.  Lovenia Kim, MD Department of neurosurgery

## 2022-12-26 NOTE — Patient Instructions (Signed)
LOCAL PHYSICAL THERAPY  [facility]  [address]  [place], [place] [address]  [phone]  [facility]  [address] [place], [place] [address]  [phone]  [facility] (2 locations)  [address]  [place], [place] [address]  [phone]          or  [address]  [place], [place] [address]  [phone]  [facility]  [address]  [place], [place] [address]  [phone]  **dry needling**  [facility]  [address]  [place], [place] [address]  [phone]  Fax: [phone]  ** Aquatic therapy[address] [place], [place] [address] [phone] **Aquatic therapy**  [facility]  [facility]  [address]  [place], [place] [address]  [phone]  [facility]  [address]  [place], [place] [address]  [phone]  [facility]  [address]   Janora Norlander  [place], [place] [address]  [phone]  [facility]  [address]  [place], [place] [address]  [phone]  **dry needling**   PELVIC FLOOR/SI JOINT  [facility]  Mariane Masters, PT  [email or web address]   [place]  [facility]  [address]  [address] [place], [place] [address]  [phone]   [facility] [place]  [address], [place] [address]  [phone]   [facility], [place]  [facility]  Raymond Gurney, PT  [address]  [place], [place] [address] [phone]   Samara Deist  [facility]  [address]  [phone]   [facility]  [address]  [phone]   [facility]  Annamaria Boots [facility]  [address]  [place], [place] [address]  [phone]   **No Aetna or medicaid**  [facility]  [phone] [address], [place] [address]  [phone]  [facility]  [address]  [place], [place] [address]  [phone]  Dannial Monarch, DC  [address], [place] [address]  [phone]  [facility]  [address], [place] [address]  [phone]  [facility]  [address]  [place], [place] [address]  [phone]  [facility]  (3 locations)  [address]  [place], [place] [address]  [phone]  **dry needling**           or  [address] [place], [place] [address]  [phone]  **Additionally has Gloris Manchester, OT**           or  [address]  [place], [place] [address]  [phone]  **Pediatric therapy**  [facility]  [address]  [phone]  **dry needlingVerdie Drown [facility]  [address]  [place], [place] [address]  [phone]  [facility]   (Inside [address] Fitness)  [address]  [place], [place] [address]  [phone]  **dry needling**  **MEDICAID or UNINSURED** [facility] dept. Of Physical Therapy [place], [place] [address] [phone]  [facility]  [address] [place], [place] [address]  [phone]   [facility]  [address]  [place], [place] [address]  [phone]   Doreatha Martin  [facility]  [address] [place], [place] [address]  [phone]   [facility]  [address]  [place], [place] [address]  [phone]   [facility]  [address] [place], [place] [address]  [phone]  [facility]  [address]  [place], [place] [address]  [phone]  AQUATIC  Kathalene Frames [facility]  [place] [facility]  [facility]  [place]  [place]  *Residents only*   [facility] [employer]  [facility]  Exercise class  [facility]  Exercise class  [facility]  [address]  [place], [place]   [phone]  [facility]  [address]  [place], [place] [address]  [phone]   [place], [place]  [facility]  [address]  [phone]   [facility]  [facility]  [address]  [phone]           or  [address]  [phone]   [facility]   Provides education and support and practical information for coping with arthritis for arthritis sufferers and their families.   When: 12:15 - 1:30 p.m. the second Monday of each month, March through December  Info: Call Rehabilitation Services at [phone]

## 2022-12-27 DIAGNOSIS — H5213 Myopia, bilateral: Secondary | ICD-10-CM | POA: Diagnosis not present

## 2023-02-11 ENCOUNTER — Ambulatory Visit: Payer: Medicaid Other | Attending: Neurosurgery | Admitting: Physical Therapy

## 2023-02-11 NOTE — Therapy (Unsigned)
OUTPATIENT PHYSICAL THERAPY THORACOLUMBAR EVALUATION   Patient Name: Mike Herrera MRN: [id number] DOB:1970-06-15, 52 y.o., male Today's Date: 02/11/2023  END OF SESSION:   Past Medical History:  Diagnosis Date   Medical history non-contributory    Past Surgical History:  Procedure Laterality Date   LUMBAR LAMINECTOMY/DECOMPRESSION MICRODISCECTOMY Right 11/20/2022   Procedure: RIGHT L2-3 MICRODISCECTOMY;  Surgeon: Lovenia Kim, MD;  Location: [facility] ORS;  Service: Neurosurgery;  Laterality: Right;   NO PAST SURGERIES     Patient Active Problem List   Diagnosis Date Noted   HNP (herniated nucleus pulposus), lumbar 11/19/2022   Lumbar radiculopathy 11/19/2022   Weakness of right leg 11/19/2022    [facility]: [facility]  REFERRING PROVIDER: Lovenia Kim, MD  REFERRING DIAG: ***  RATIONALE FOR EVALUATION AND TREATMENT: {HABREHAB:27488}  THERAPY DIAG: Other low back pain  Muscle weakness (generalized)  Difficulty in walking, not elsewhere classified  ONSET DATE: ***  FOLLOW-UP APPT SCHEDULED WITH REFERRING PROVIDER: {yes/no:20286}    SUBJECTIVE:                                                                                                                                                                                         SUBJECTIVE STATEMENT:  ***  PERTINENT HISTORY: ***  PAIN:    Pain Intensity: Present: /10, Best: /10, Worst: /10 Pain location: *** Pain Quality: {PAIN DESCRIPTION:21022940}  Radiating: {yes/no:20286}  Numbness/Tingling: {yes/no:20286} Focal Weakness: {yes/no:20286} Aggravating factors: *** Relieving factors: *** 24-hour pain behavior: *** How long can you sit: How long can you stand: History of prior back injury, pain, surgery, or therapy: {yes/no:20286} Dominant hand: {RIGHT/LEFT:20294} Imaging: {yes/no:20286}  Red flags: Negative for bowel/bladder changes, saddle paresthesia, personal history of cancer,  h/o spinal tumors, h/o compression fx, h/o abdominal aneurysm, abdominal pain, chills/fever, night sweats, nausea, vomiting, unrelenting pain, first onset of insidious LBP <20 y/o  PRECAUTIONS: {Therapy precautions:24002}  WEIGHT BEARING RESTRICTIONS: {Yes ***/No:24003}  FALLS: Has patient fallen in last 6 months? {fallsyesno:27318}  Living Environment Lives with: {OPRC lives with:25569::"lives with their family"} Lives in: {Lives in:[address]} Stairs: {opstairs:27293} Has following equipment at home: {Assistive devices:23999}  Prior level of function: {PLOF:24004}  Occupational demands:   Hobbies:   Patient Goals: ***   OBJECTIVE:  Patient Surveys  {rehab surveys:24030}  Cognition Patient is oriented to person, place, and time.  Recent memory is intact.  Remote memory is intact.  Attention span and concentration are intact.  Expressive speech is intact.  Patient's fund of knowledge is within normal limits for educational level.    Gross Musculoskeletal Assessment Tremor: None Bulk: Normal Tone: Normal No visible step-off  along spinal column, no signs of scoliosis  GAIT: Distance walked: *** Assistive device utilized: {Assistive devices:23999} Level of assistance: {Levels of assistance:24026} Comments: ***  Posture: Lumbar lordosis: WNL Iliac crest height: Equal bilaterally Lumbar lateral shift: Negative  AROM AROM (Normal range in degrees) AROM   Lumbar   Flexion (65)   Extension (30)   Right lateral flexion (25)   Left lateral flexion (25)   Right rotation (30)   Left rotation (30)       Hip Right Left  Flexion (125)    Extension (15)    Abduction (40)    Adduction     Internal Rotation (45)    External Rotation (45)        Knee    Flexion (135)    Extension (0)        Ankle    Dorsiflexion (20)    Plantarflexion (50)    Inversion (35)    Eversion (15)    (* = pain; Blank rows = not tested)  LE MMT: MMT (out of 5) Right  Left   Hip  flexion    Hip extension    Hip abduction    Hip adduction    Hip internal rotation    Hip external rotation    Knee flexion    Knee extension    Ankle dorsiflexion    Ankle plantarflexion    Ankle inversion    Ankle eversion    (* = pain; Blank rows = not tested)  Sensation Grossly intact to light touch throughout bilateral LEs as determined by testing dermatomes L2-S2. Proprioception, stereognosis, and hot/cold testing deferred on this date.  Reflexes R/L Knee Jerk (L3/4): 2+/2+  Ankle Jerk (S1/2): 2+/2+   Muscle Length Hamstrings: R: {NEGATIVE/POSITIVE WGN:56213} L: {NEGATIVE/POSITIVE YQM:57846} Ely (quadriceps): R: {NEGATIVE/POSITIVE NGE:95284} L: {NEGATIVE/POSITIVE XLK:44010} Thomas (hip flexors): R: {NEGATIVE/POSITIVE UVO:53664} L: {NEGATIVE/POSITIVE QIH:47425} Ober: R: {NEGATIVE/POSITIVE ZDG:38756} L: {NEGATIVE/POSITIVE EPP:29518}  Palpation Location Right Left         Lumbar paraspinals    Quadratus Lumborum    Gluteus Maximus    Gluteus Medius    Deep hip external rotators    PSIS    Fortin's Area (SIJ)    Greater Trochanter    (Blank rows = not tested) Graded on 0-4 scale (0 = no pain, 1 = pain, 2 = pain with wincing/grimacing/flinching, 3 = pain with withdrawal, 4 = unwilling to allow palpation)  Passive Accessory Intervertebral Motion Pt denies reproduction of back pain with CPA L1-L5 and UPA bilaterally L1-L5. Generally, hypomobile throughout  Special Tests Lumbar Radiculopathy and Discogenic: Centralization and Peripheralization (SN 92, -LR 0.12): {NEGATIVE/POSITIVE FOR:19998} Slump (SN 83, -LR 0.32): R: {NEGATIVE/POSITIVE FOR:19998} L: {NEGATIVE/POSITIVE FOR:19998} SLR (SN 92, -LR 0.29): R: {NEGATIVE/POSITIVE ACZ:66063} L:  {NEGATIVE/POSITIVE KZS:01093} Crossed SLR (SP 90): R: {NEGATIVE/POSITIVE ATF:57322} L: {NEGATIVE/POSITIVE GUR:42706}  Facet Joint: Extension-Rotation (SN 100, -LR 0.0): R: {NEGATIVE/POSITIVE CBJ:62831} L: {NEGATIVE/POSITIVE  DVV:61607}  Lumbar Foraminal Stenosis: Lumbar quadrant (SN 70): R: {NEGATIVE/POSITIVE PXT:06269} L: {NEGATIVE/POSITIVE SWN:46270}  Hip: FABER (SN 81): R: {NEGATIVE/POSITIVE JJK:09381} L: {NEGATIVE/POSITIVE WEX:93716} FADIR (SN 94): R: {NEGATIVE/POSITIVE FOR:19998} L: {NEGATIVE/POSITIVE RCV:89381} Hip scour (SN 50): R: {NEGATIVE/POSITIVE OFB:51025} L: {NEGATIVE/POSITIVE ENI:77824}  SIJ:  Thigh Thrust (SN 88, -LR 0.18) : R: {NEGATIVE/POSITIVE MPN:36144} L: {NEGATIVE/POSITIVE RXV:40086}  Piriformis Syndrome: FAIR Test (SN 88, SP 83): R: {NEGATIVE/POSITIVE PYP:95093} L: {NEGATIVE/POSITIVE OIZ:12458}  Functional Tasks Lifting: Deep squat: Sit to stand: Forward Step-Down Test: R:  L:  Lateral Step-Down Test: R:  L:  Beighton scale  LEFT  RIGHT           1. Passive dorsiflexion and hyperextension of the fifth MCP joint beyond 90  0 0   2. Passive apposition of the thumb to the flexor aspect of the forearm  0  0   3. Passive hyperextension of the elbow beyond 10  0  0   4. Passive hyperextension of the knee beyond 10  0  0   5. Active forward flexion of the trunk with the knees fully extended so that the palms of the hands rest flat on the floor   0   TOTAL         0/ 9      TODAY'S TREATMENT: DATE: ***     PATIENT EDUCATION:  Education details: *** Person educated: {Person educated:25204} Education method: {Education Method:25205} Education comprehension: {Education Comprehension:25206}   HOME EXERCISE PROGRAM:     ASSESSMENT:  CLINICAL IMPRESSION: Patient is a *** y.o. *** who was seen today for physical therapy evaluation and treatment for ***.   OBJECTIVE IMPAIRMENTS: {opptimpairments:25111}.   ACTIVITY LIMITATIONS: {activitylimitations:27494}  PARTICIPATION LIMITATIONS: {participationrestrictions:25113}  PERSONAL FACTORS: {Personal factors:25162} are also affecting patient's functional outcome.   REHAB POTENTIAL: {rehabpotential:25112}  CLINICAL  DECISION MAKING: {clinical decision making:25114}  EVALUATION COMPLEXITY: {Evaluation complexity:25115}   GOALS: Goals reviewed with patient? {yes/no:20286}  SHORT TERM GOALS: Target date: {follow up:25551}  Pt will be independent with HEP in order to improve strength and decrease back pain to improve pain-free function at home and work. Baseline: *** Goal status: INITIAL   LONG TERM GOALS: Target date: {follow up:25551}  Pt will increase FOTO to at least *** to demonstrate significant improvement in function at home and work related to back pain  Baseline:  Goal status: INITIAL  2.  Pt will decrease worst back pain by at least 2 points on the NPRS in order to demonstrate clinically significant reduction in back pain. Baseline: *** Goal status: INITIAL  3.  Pt will decrease mODI score by at least 13 points in order demonstrate clinically significant reduction in back pain/disability.       Baseline: *** Goal status: INITIAL  4.  *** Baseline: *** Goal status: INITIAL   PLAN: PT FREQUENCY: 1-2x/week  PT DURATION: {rehab duration:25117}  PLANNED INTERVENTIONS: Therapeutic exercises, Therapeutic activity, Neuromuscular re-education, Balance training, Gait training, Patient/Family education, Self Care, Joint mobilization, Joint manipulation, Vestibular training, Canalith repositioning, Orthotic/Fit training, DME instructions, Dry Needling, Electrical stimulation, Spinal manipulation, Spinal mobilization, Cryotherapy, Moist heat, Taping, Traction, Ultrasound, Ionotophoresis 4mg /ml Dexamethasone, Manual therapy, and Re-evaluation.  PLAN FOR NEXT SESSION: ***   Lynnea Maizes PT, DPT, GCS  Gertie Exon, PT 02/11/2023, 7:32 AM

## 2023-02-12 NOTE — Progress Notes (Unsigned)
   REFERRING PHYSICIAN:  [facility], Charles Kenard Gower [facility] [address] [place],  [place] [address]  DOS: 11/20/22  Right L2-L3 microdiscectomy  HISTORY OF PRESENT ILLNESS:  He was doing well at his last visit and was started in PT.   He is doing very well. He has no LBP or leg pain. He notes  intermittent muscle spasms in his right leg. Robaxin was helping and he needs a refill.   He feels like strength in right leg is back to normal.    PHYSICAL EXAMINATION:  General: Patient is well developed, well nourished, calm, collected, and in no apparent distress.   NEUROLOGICAL:  General: In no acute distress.   Awake, alert, oriented to person, place, and time.  Pupils equal round and reactive to light.  Facial tone is symmetric.     Strength:          Side Iliopsoas Quads Hamstring PF DF EHL  R 5 5 5 5 5 5   L 5 5 5 5 5 5    Incision well healed   ROS (Neurologic):  Negative except as noted above  IMAGING: Nothing new to review.   ASSESSMENT/PLAN:  Mike Herrera is doing well s/p above surgery. His right leg strength is improved. He has intermittent spasms in right leg that are improving.  Treatment options reviewed with patient and following plan made:   - He can slowly return to activity as tolerated. Will still be careful with lifting.  - He is trying to get a landscaping job. Note given that he can return to full duty with no restrictions.  - Refill given on robaxin. Reviewed dosing and side effects.  - Follow up prn.   Advised to contact the office if any questions or concerns arise.  Drake Leach PA-C Department of neurosurgery

## 2023-02-13 ENCOUNTER — Ambulatory Visit (INDEPENDENT_AMBULATORY_CARE_PROVIDER_SITE_OTHER): Payer: Medicaid Other | Admitting: Orthopedic Surgery

## 2023-02-13 ENCOUNTER — Encounter: Payer: Self-pay | Admitting: Orthopedic Surgery

## 2023-02-13 VITALS — BP 124/86 | Ht 71.0 in | Wt 263.0 lb

## 2023-02-13 DIAGNOSIS — M5126 Other intervertebral disc displacement, lumbar region: Secondary | ICD-10-CM

## 2023-02-13 DIAGNOSIS — M5416 Radiculopathy, lumbar region: Secondary | ICD-10-CM

## 2023-02-13 DIAGNOSIS — M47816 Spondylosis without myelopathy or radiculopathy, lumbar region: Secondary | ICD-10-CM

## 2023-02-13 DIAGNOSIS — Z9889 Other specified postprocedural states: Secondary | ICD-10-CM

## 2023-02-13 MED ORDER — METHOCARBAMOL 500 MG PO TABS
500.0000 mg | ORAL_TABLET | Freq: Three times a day (TID) | ORAL | 0 refills | Status: DC | PRN
Start: 2023-02-13 — End: 2023-04-05

## 2023-02-18 ENCOUNTER — Ambulatory Visit: Payer: Medicaid Other | Admitting: Physical Therapy

## 2023-02-20 ENCOUNTER — Encounter: Payer: Medicaid Other | Admitting: Physical Therapy

## 2023-02-25 ENCOUNTER — Encounter: Payer: Medicaid Other | Admitting: Physical Therapy

## 2023-02-27 ENCOUNTER — Encounter: Payer: Medicaid Other | Admitting: Physical Therapy

## 2023-03-04 ENCOUNTER — Encounter: Payer: Medicaid Other | Admitting: Physical Therapy

## 2023-03-11 ENCOUNTER — Encounter: Payer: Medicaid Other | Admitting: Physical Therapy

## 2023-03-14 ENCOUNTER — Encounter: Payer: Medicaid Other | Admitting: Physical Therapy

## 2023-03-18 ENCOUNTER — Encounter: Payer: Medicaid Other | Admitting: Physical Therapy

## 2023-03-20 ENCOUNTER — Encounter: Payer: Medicaid Other | Admitting: Physical Therapy

## 2023-03-25 ENCOUNTER — Encounter: Payer: Medicaid Other | Admitting: Physical Therapy

## 2023-03-27 ENCOUNTER — Encounter: Payer: Medicaid Other | Admitting: Physical Therapy

## 2023-03-27 NOTE — Telephone Encounter (Signed)
 Patient scheduled.

## 2023-03-28 NOTE — Progress Notes (Signed)
   REFERRING PHYSICIAN:  Center, Charles Kenard [facility] [address] [address] [place],  [place] [address]  DOS: 11/20/22  Right L2-L3 microdiscectomy  HISTORY OF PRESENT ILLNESS:  He was doing well at his last visit and was released to follow up prn.   He was pulling a riding lawnmower 3 days ago and felt a pull in right side of his lower back. Since then, he's had intermittent right sided LBP that is worse when he gets up from seated position. No pain with walking. No leg pain. He's had chronic numbness in right anterior knee since before surgery, no change in this. No weakness or tingling in his legs.   He is taking robaxin at night prn. He has taken motrin with relief.    PHYSICAL EXAMINATION:  General: Patient is well developed, well nourished, calm, collected, and in no apparent distress.  Incision is well healed. He has mild right sided lumbar tenderness. No tenderness over vertebrae.    Side Iliopsoas Quads Hamstring PF DF EHL  R 5 5 5 5 5 5   L 5 5 5 5 5 5    Reflexes are 2+ and symmetric at the biceps, brachioradialis, patella and achilles.   No pain with IR/ER of both hips.  Bilateral lower extremity sensation is intact to light touch.     Gait is normal.    ROS (Neurologic):  Negative except as noted above  IMAGING: Nothing new to review.   ASSESSMENT/PLAN:  Mike Herrera is doing well s/p above surgery until 3 days ago when he pulled a riding lawnmower and felt a pull in his back.   Since that time, he's had intermittent right sided LBP that is worse when he gets up from seated position. No pain with walking. No leg pain. No weakness or tingling in his legs.   He has some right sided lumbar tenderness as above, pain appears to be more myofascial in nature.   Treatment options reviewed with patient and following plan made:   - Prescription for medrol dose pack to help with symptoms. Reviewed dosing and side effects. Stop motrin while taking dose pack.   - Continue prn robaxin. Reviewed dosing and side effects.  - Will message him next week to check on his progress. If no improvement, consider imaging.   Advised to contact the office if any questions or concerns arise.  I spent a total of 15 minutes in face-to-face and non-face-to-face activities related to this patient's care today including review of outside records, review of imaging, review of symptoms, physical exam, discussion of differential diagnosis, discussion of treatment options, and documentation.   Drake Leach PA-C Department of neurosurgery

## 2023-03-29 ENCOUNTER — Ambulatory Visit (INDEPENDENT_AMBULATORY_CARE_PROVIDER_SITE_OTHER): Payer: Medicaid Other | Admitting: Orthopedic Surgery

## 2023-03-29 ENCOUNTER — Encounter: Payer: Self-pay | Admitting: Orthopedic Surgery

## 2023-03-29 VITALS — BP 126/88 | Ht 71.0 in | Wt 263.0 lb

## 2023-03-29 DIAGNOSIS — M47816 Spondylosis without myelopathy or radiculopathy, lumbar region: Secondary | ICD-10-CM | POA: Diagnosis not present

## 2023-03-29 DIAGNOSIS — Z9889 Other specified postprocedural states: Secondary | ICD-10-CM | POA: Diagnosis not present

## 2023-03-29 DIAGNOSIS — M545 Low back pain, unspecified: Secondary | ICD-10-CM

## 2023-03-29 MED ORDER — METHYLPREDNISOLONE 4 MG PO TBPK
ORAL_TABLET | ORAL | 0 refills | Status: DC
Start: 2023-03-29 — End: 2023-07-11

## 2023-04-05 DIAGNOSIS — M545 Low back pain, unspecified: Secondary | ICD-10-CM

## 2023-04-05 DIAGNOSIS — Z9889 Other specified postprocedural states: Secondary | ICD-10-CM

## 2023-04-05 MED ORDER — TIZANIDINE HCL 4 MG PO TABS
4.0000 mg | ORAL_TABLET | Freq: Three times a day (TID) | ORAL | 0 refills | Status: DC | PRN
Start: 2023-04-05 — End: 2023-05-16

## 2023-04-05 NOTE — Addendum Note (Signed)
Addended byDrake Leach on: 04/05/2023 01:56 PM   Modules accepted: Orders

## 2023-04-05 NOTE — Addendum Note (Signed)
Addended byDrake Leach on: 04/05/2023 12:13 PM   Modules accepted: Orders

## 2023-05-16 ENCOUNTER — Telehealth: Payer: Self-pay | Admitting: Orthopedic Surgery

## 2023-05-16 DIAGNOSIS — M545 Low back pain, unspecified: Secondary | ICD-10-CM

## 2023-05-16 DIAGNOSIS — Z9889 Other specified postprocedural states: Secondary | ICD-10-CM

## 2023-05-16 MED ORDER — TIZANIDINE HCL 4 MG PO TABS
4.0000 mg | ORAL_TABLET | Freq: Three times a day (TID) | ORAL | 0 refills | Status: DC | PRN
Start: 2023-05-16 — End: 2023-07-22

## 2023-05-16 NOTE — Telephone Encounter (Signed)
 Refill of zanaflex sent to his pharmacy. Please let him know.   He has f/u with me on 05/27/23.

## 2023-05-27 ENCOUNTER — Ambulatory Visit: Payer: Medicaid Other | Admitting: Orthopedic Surgery

## 2023-06-28 NOTE — Progress Notes (Signed)
   REFERRING PHYSICIAN:  Center, Charles Carlus [place] [facility] [address] [place],  [place] [address]  DOS: 11/20/22  Right L2-L3 microdiscectomy  HISTORY OF PRESENT ILLNESS:  Last seen by me on 03/29/23 with increased right sided LBP. He has minimal relief with medrol  dose pack. PT was ordered and he has not started it.   He is here for follow up.   He continues with intermittent right sided lateral hip pain that is more tolerable. Pain is worse with sitting on soft surface like a couch. No leg pain. He's had chronic numbness in right anterior knee since before surgery, no change in this. No weakness or tingling in his legs.   He is taking zanaflex  prn.    PHYSICAL EXAMINATION:  General: Patient is well developed, well nourished, calm, collected, and in no apparent distress.  Incision is well healed. No right sided lumbar tenderness. Minimal tenderness right lateral hip/pelvis.   Side Iliopsoas Quads Hamstring PF DF EHL  R 5 5 5 5 5 5   L 5 5 5 5 5 5    Reflexes are 2+ and symmetric at the biceps, brachioradialis, patella and achilles.   No pain with IR/ER of both hips. No tenderness over bilateral GTB.   Bilateral lower extremity sensation is intact to light touch.     Gait is normal.    ROS (Neurologic):  Negative except as noted above  IMAGING: Nothing new to review.   ASSESSMENT/PLAN:  Mike Herrera is doing better. Current right sided back/hip pain appears more myofascial and is improving.   He is working in [employer]. 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  Treatment options reviewed with patient and following plan made:   - Continue prn zanaflex . Reviewed dosing and side effects.  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 This is pending his follow up with me.  - Hold on PT for now as he is improving.  - Follow up in 6-8 weeks and prn.   Advised to contact the office if any questions or  concerns arise.  I spent a total of 15 minutes in face-to-face and non-face-to-face activities related to this patient's care today including review of outside records, review of imaging, review of symptoms, physical exam, discussion of differential diagnosis, discussion of treatment options, and documentation.   Lucetta Russel PA-C Department of neurosurgery

## 2023-07-11 ENCOUNTER — Encounter: Payer: Self-pay | Admitting: Orthopedic Surgery

## 2023-07-11 ENCOUNTER — Ambulatory Visit: Admitting: Orthopedic Surgery

## 2023-07-11 VITALS — BP 136/88 | Ht 71.0 in | Wt 263.0 lb

## 2023-07-11 DIAGNOSIS — M545 Low back pain, unspecified: Secondary | ICD-10-CM | POA: Diagnosis not present

## 2023-07-11 DIAGNOSIS — Z9889 Other specified postprocedural states: Secondary | ICD-10-CM | POA: Diagnosis not present

## 2023-07-22 ENCOUNTER — Emergency Department
Admission: EM | Admit: 2023-07-22 | Discharge: 2023-07-22 | Disposition: A | Discharge status: Home / Self Care | Attending: Emergency Medicine | Admitting: Emergency Medicine

## 2023-07-22 ENCOUNTER — Other Ambulatory Visit: Payer: Self-pay

## 2023-07-22 ENCOUNTER — Ambulatory Visit (INDEPENDENT_AMBULATORY_CARE_PROVIDER_SITE_OTHER): Admitting: Orthopedic Surgery

## 2023-07-22 ENCOUNTER — Encounter: Payer: Self-pay | Admitting: Orthopedic Surgery

## 2023-07-22 VITALS — BP 136/86 | Ht 71.0 in | Wt 263.0 lb

## 2023-07-22 DIAGNOSIS — M5416 Radiculopathy, lumbar region: Secondary | ICD-10-CM | POA: Diagnosis not present

## 2023-07-22 DIAGNOSIS — M545 Low back pain, unspecified: Secondary | ICD-10-CM

## 2023-07-22 DIAGNOSIS — M47816 Spondylosis without myelopathy or radiculopathy, lumbar region: Secondary | ICD-10-CM

## 2023-07-22 DIAGNOSIS — Z9889 Other specified postprocedural states: Secondary | ICD-10-CM

## 2023-07-22 DIAGNOSIS — R109 Unspecified abdominal pain: Secondary | ICD-10-CM | POA: Diagnosis present

## 2023-07-22 LAB — BASIC METABOLIC PANEL WITH GFR
Anion gap: 7 (ref 5–15)
BUN: 16 mg/dL (ref 6–20)
CO2: 26 mmol/L (ref 22–32)
Calcium: 8.9 mg/dL (ref 8.9–10.3)
Chloride: 108 mmol/L (ref 98–111)
Creatinine, Ser: 0.87 mg/dL (ref 0.61–1.24)
GFR, Estimated: >60 mL/min (ref >60)
Glucose, Bld: 108 mg/dL — ABNORMAL HIGH (ref 70–99)
Potassium: 4.1 mmol/L (ref 3.5–5.1)
Sodium: 141 mmol/L (ref 135–145)

## 2023-07-22 LAB — URINALYSIS, ROUTINE W REFLEX MICROSCOPIC
Bacteria, UA: NONE SEEN
Bilirubin Urine: NEGATIVE
Glucose, UA: NEGATIVE mg/dL
Ketones, ur: NEGATIVE mg/dL
Leukocytes,Ua: NEGATIVE
Nitrite: NEGATIVE
Protein, ur: NEGATIVE mg/dL
Specific Gravity, Urine: 1.021 (ref 1.005–1.030)
pH: 5 (ref 5.0–8.0)

## 2023-07-22 LAB — CBC
HCT: 43.2 % (ref 39.0–52.0)
Hemoglobin: 14.5 g/dL (ref 13.0–17.0)
MCH: 28.6 pg (ref 26.0–34.0)
MCHC: 33.6 g/dL (ref 30.0–36.0)
MCV: 85.2 fL (ref 80.0–100.0)
Platelets: 239 10*3/uL (ref 150–400)
RBC: 5.07 MIL/uL (ref 4.22–5.81)
RDW: 14.6 % (ref 11.5–15.5)
WBC: 7.6 10*3/uL (ref 4.0–10.5)
nRBC: 0 % (ref 0.0–0.2)

## 2023-07-22 MED ORDER — TIZANIDINE HCL 2 MG PO TABS: 2.0000 mg | ORAL_TABLET | Freq: Three times a day (TID) | ORAL | 0 refills | Status: DC

## 2023-07-22 MED ORDER — TIZANIDINE HCL 4 MG PO TABS
2.0000 mg | ORAL_TABLET | Freq: Once | ORAL | Status: AC
  Administered 2023-07-22: 2 mg via ORAL
  Filled 2023-07-22: qty 1

## 2023-07-22 MED ORDER — KETOROLAC TROMETHAMINE 30 MG/ML IJ SOLN
30.0000 mg | Freq: Once | INTRAMUSCULAR | Status: DC
  Filled 2023-07-22: qty 1

## 2023-07-22 MED ORDER — ETODOLAC 400 MG PO TABS: 400.0000 mg | ORAL_TABLET | Freq: Two times a day (BID) | ORAL | 0 refills | Status: DC

## 2023-07-22 NOTE — Telephone Encounter (Signed)
 Patient came in to the office and is being seen by our provider.

## 2023-07-22 NOTE — Progress Notes (Signed)
   REFERRING PHYSICIAN:  Center, Charles Carlus [place] [facility] [address] [place],  [place] [address]  DOS: 11/20/22  Right L2-L3 microdiscectomy  HISTORY OF PRESENT ILLNESS:  Last seen by me on 07/11/23  with increased right sided LBP. He has minimal relief with medrol  dose pack. PT was ordered and he has not started it.   He was in ED today for increased right sided flank pain with urinary frequency.   He had to do more lifting at work and noted increased pain in right side of his back. He was given note for light duty at last visit, they would not accept it. No leg pain. He's had chronic numbness in right anterior knee since before surgery, no change in this. No weakness or tingling in his legs.   He is taking zanaflex  prn.    PHYSICAL EXAMINATION:  General: Patient is well developed, well nourished, calm, collected, and in no apparent distress.  He has mild right sided lumbar tenderness. No tenderness over lateral hip. No tenderness over right greater trochanteric bursa. No tenderness over lumbar vertebrae.   Side Iliopsoas Quads Hamstring PF DF EHL  R 5 5 5 5 5 5   L 5 5 5 5 5 5    Reflexes are 2+ and symmetric at the patella and achilles.   No pain with IR/ER of both hips.   Bilateral lower extremity sensation is intact to light touch.     Gait is normal.    ROS (Neurologic):  Negative except as noted above  IMAGING: Nothing new to review.   ASSESSMENT/PLAN:  ARACELI ORVIS continues with intermittent right sided LBP . This was better but is worse with any heavy lifting at work. He was seen in ED today.  He has no leg pain. He's had chronic numbness in right anterior knee since before surgery, no change in this. No weakness or tingling in his legs.  Treatment options reviewed with patient and following plan made:   - Continue prn zanaflex . Reviewed dosing and side effects. Changed to 2mg  as the 4mg  made him sleepy.  - Okay to start lodine from ED. Reviewed  dosing and side effects. Take with food.  - Discussed medrol  dose pack. Will hold for now- it made him mean.  - Given work note keeping him out of work until his follow up with me on 09/11/23. May need to find job with no heavy lifting.   - If he gets worse or no better, consider updated lumbar imaging and/or PT.  - Follow up as scheduled on 09/11/23.   Advised to contact the office if any questions or concerns arise.  I spent a total of 15 minutes in face-to-face and non-face-to-face activities related to this patient's care today including review of outside records, review of imaging, review of symptoms, physical exam, discussion of differential diagnosis, discussion of treatment options, and documentation.   Lucetta Russel PA-C Department of neurosurgery

## 2023-07-22 NOTE — ED Notes (Signed)
 This RN reviewed paperwork with pt. No further complaints or questions. Pt ambulated to lobby.

## 2023-07-22 NOTE — ED Triage Notes (Signed)
 Pt comes with back and right side pain. Pt states some frequent urination. Pt states pain is worse in morning. Pt states once he pees it eases off.

## 2023-07-22 NOTE — ED Provider Notes (Signed)
 [facility] Provider Note    Event Date/Time   First MD Initiated Contact with Patient 07/22/23 [phone]     (approximate)   History   Back Pain   HPI  Mike Herrera is a 53 y.o. male   presents to the ED with complaint of right sided flank pain radiating around to his right front abdomen.  Patient states he has some frequent urination.  No prior history of kidney stones.  Patient does have a history of lumbar pain with surgery in the past.  He has also had sciatica on the right side.      Physical Exam   Triage Vital Signs: ED Triage Vitals  Encounter Vitals Group     BP 07/22/23 0903 (!) 145/100     Systolic BP Percentile --      Diastolic BP Percentile --      Pulse Rate 07/22/23 0903 84     Resp 07/22/23 0903 18     Temp 07/22/23 0903 98 F (36.7 C)     Temp src --      SpO2 07/22/23 0903 99 %     Weight 07/22/23 0901 263 lb (119.3 kg)     Height 07/22/23 0901 5\' 11"  (1.803 m)     Head Circumference --      Peak Flow --      Pain Score 07/22/23 0901 6     Pain Loc --      Pain Education --      Exclude from Growth Chart --     Most recent vital signs: Vitals:   07/22/23 0903  BP: (!) 145/100  Pulse: 84  Resp: 18  Temp: 98 F (36.7 C)  SpO2: 99%     General: Awake, no distress.  CV:  Good peripheral perfusion.  Resp:  Normal effort.  Abd:  No distention.  Soft, nontender, bowel sounds normoactive x 4 quadrants.  No CVA tenderness is noted. Other:  No point tenderness on palpation of the thoracic or lumbar spine.  Good muscle strength bilaterally and patient is able to stand and ambulate without any assistance.  Reflexes are 2+ bilaterally.   ED Results / Procedures / Treatments   Labs (all labs ordered are listed, but only abnormal results are displayed) Labs Reviewed  URINALYSIS, ROUTINE W REFLEX MICROSCOPIC - Abnormal; Notable for the following components:      Result Value   Color, Urine YELLOW (*)    APPearance CLEAR  (*)    Hgb urine dipstick SMALL (*)    All other components within normal limits  BASIC METABOLIC PANEL WITH GFR - Abnormal; Notable for the following components:   Glucose, Bld 108 (*)    All other components within normal limits  CBC       PROCEDURES:  Critical Care performed:   Procedures   MEDICATIONS ORDERED IN ED: Medications  tiZANidine  (ZANAFLEX ) tablet 2 mg (2 mg Oral Given 07/22/23 1045)     IMPRESSION / MDM / ASSESSMENT AND PLAN / ED COURSE  I reviewed the triage vital signs and the nursing notes.   Differential diagnosis includes, but is not limited to, urinary tract infection, urolithiasis, muscle skeletal pain, compression fracture.  No symptoms to suggest cauda equina.  53 year old male presents to the ED with complaints of right flank pain without history of injury.  Lab work reassuring as well as urinalysis.  Patient was made aware that most likely this is his sciatica and he  reports that he was doing fine until he ran out of his Zanaflex  which he takes at night.  Patient states that the 4 mg tablet makes him extremely drowsy and therefore cannot take it during the day.  We discussed changing his dosage to 2 mg to see if this is easier for him to tolerate.  A prescription for this and etodolac 400 mg twice daily with food was sent to the pharmacy.  He is to follow-up with his PCP if any continued problems return to the emergency department if any severe worsening of his symptoms.      Patient's presentation is most consistent with acute complicated illness / injury requiring diagnostic workup.  FINAL CLINICAL IMPRESSION(S) / ED DIAGNOSES   Final diagnoses:  Lumbar radiculopathy     Rx / DC Orders   ED Discharge Orders          Ordered    tiZANidine  (ZANAFLEX ) 2 MG tablet  3 times daily        07/22/23 1043    etodolac (LODINE) 400 MG tablet  2 times daily        07/22/23 1043             Note:  This document was prepared using Dragon voice  recognition software and may include unintentional dictation errors.   Stafford Eagles, PA-C 07/22/23 1300    Mike Carbine, MD 07/22/23 1505

## 2023-07-22 NOTE — Discharge Instructions (Signed)
 Follow-up with your primary care provider or neurosurgery, Lucetta Russel, PA-C. Prescriptions were sent to the pharmacy for inflammation and also your muscle relaxant.  The tizanidine  is 2 mg which is half the dose that you have been taking.  This may make it easier for you to take during the day to help relax your muscles.  If this also is too sedating to take during the day you can return to taking 4 mg at bedtime as you have been doing.  Moist heat or ice to your back as needed for discomfort.  Return to the emergency department if any severe worsening of your symptoms such as loss of bowel or bladder control.

## 2023-08-02 MED ORDER — DICLOFENAC SODIUM 75 MG PO TBEC: 75.0000 mg | DELAYED_RELEASE_TABLET | Freq: Two times a day (BID) | ORAL | 1 refills | Status: DC

## 2023-08-02 NOTE — Addendum Note (Signed)
 Addended byLucetta Russel on: 08/02/2023 04:12 PM   Modules accepted: Orders

## 2023-08-05 ENCOUNTER — Encounter: Payer: Self-pay | Admitting: Emergency Medicine

## 2023-08-05 ENCOUNTER — Other Ambulatory Visit: Payer: Self-pay

## 2023-08-05 ENCOUNTER — Emergency Department
Admission: EM | Admit: 2023-08-05 | Discharge: 2023-08-05 | Disposition: A | Discharge status: Home / Self Care | Attending: Emergency Medicine | Admitting: Emergency Medicine

## 2023-08-05 DIAGNOSIS — M545 Low back pain, unspecified: Secondary | ICD-10-CM | POA: Diagnosis present

## 2023-08-05 DIAGNOSIS — M5431 Sciatica, right side: Secondary | ICD-10-CM | POA: Diagnosis not present

## 2023-08-05 DIAGNOSIS — G5711 Meralgia paresthetica, right lower limb: Secondary | ICD-10-CM | POA: Diagnosis not present

## 2023-08-05 DIAGNOSIS — M5441 Lumbago with sciatica, right side: Secondary | ICD-10-CM | POA: Diagnosis not present

## 2023-08-05 MED ORDER — OXYCODONE-ACETAMINOPHEN 5-325 MG PO TABS
1.0000 | ORAL_TABLET | Freq: Once | ORAL | Status: AC
  Administered 2023-08-05: 1 via ORAL
  Filled 2023-08-05: qty 1

## 2023-08-05 MED ORDER — LIDOCAINE 5 % EX PTCH
1.0000 | MEDICATED_PATCH | Freq: Once | CUTANEOUS | Status: DC
  Administered 2023-08-05: 1 via TRANSDERMAL
  Filled 2023-08-05: qty 1

## 2023-08-05 NOTE — ED Triage Notes (Signed)
 Pt via POV from home Pt c/o lower back pain that radiates down his R leg for the past 4 days.. Report hx of back surgery. Denies hx of sciatica. Denies any associated symptoms. Pt is A&Ox4 and NAD, ambulatory with steady gait to triage.

## 2023-08-05 NOTE — Discharge Instructions (Addendum)
 You are seen in the emergency department for lower back pain that radiated to your right leg.  It is importantly follow-up closely with your primary care physician and your spine team.  Return to the emergency department if you develop any weakness, urinary or bowel incontinence or worsening symptoms.  You can try over-the-counter Lidoderm  patches and keep on for 12 hours.  Alternate ice and heat.  Follow-up for physical therapy and discussed pain clinic with your primary care physician.  Pain control:  Ibuprofen  (motrin /aleve /advil ) - You can take 3 tablets (600 mg) every 6 hours as needed for pain/fever.  Acetaminophen  (tylenol ) - You can take 2 extra strength tablets (1000 mg) every 6 hours as needed for pain/fever.  You can alternate these medications or take them together.  Make sure you eat food/drink water when taking these medications.  Information about meralgia paresthetica -   What is meralgia paresthetica?  This is a condition that causes pain, tingling, and numbness in the outer thigh. It happens when a nerve in that area gets squeezed or compressed.  Different things can cause meralgia paresthetica. They include pregnancy, wearing tight belts or waistbands, leaning the thigh on something for a long time, and injury to the area. Sometimes, it can happen after surgery in the area.  Meralgia paresthetica is more common in people who have diabetes or obesity, and in older people. It is not a serious condition, and it usually goes away on its own.  What are the symptoms of meralgia paresthetica?  The main symptoms involve the upper, outer thigh. They can include:  ?Pain - This can be burning or stinging.  ?Tingling - This can feel like "pins and needles" in the area.  ?Numbness  ?Feeling extra sensitive to touch - Even light touch, like the feeling of clothing on the skin, might be unpleasant.  ?Itching  Symptoms usually affect only 1 of the thighs.  Will I need  tests?  Your doctor should be able to tell if you have meralgia paresthetica by learning about your symptoms and doing a "neurologic exam." In this exam, the doctor checks how your brain, nerves, and muscles are working.  Sometimes, doctors do other tests to make sure that something else is not causing your symptoms. This is more likely if you have any symptoms that are different from the ones listed above. Other tests might include:  ?MRI of the spine - This is a type of imaging test. It creates pictures of the inside of your body.  ?Nerve conduction studies or electromyography - These check how well your nerves and muscles are working.  How is meralgia paresthetica treated?  It usually goes away on its own within a few weeks or months. If your symptoms bother you, it might help to:  ?Avoid wearing tight belts or clothing with a tight waistband. These can put pressure on the nerve that runs from your lower spine to your thigh.  ?Try to keep a healthy body weight. Your doctor or nurse can talk to you about ways to lose weight if needed.  ?Take pain-relieving medicines such as acetaminophen  (brand name: Tylenol ) or ibuprofen  (sample brand names: Advil , Motrin ).  If your symptoms last for longer than 1 or 2 months, tell your doctor or nurse. They might suggest trying other treatments, such as pain medicines or a shot of medicine to numb the area. Sometimes, surgery is recommended for people with severe symptoms, but this is rare.

## 2023-08-05 NOTE — ED Provider Notes (Signed)
 [facility] Provider Note    Event Date/Time   First MD Initiated Contact with Patient 08/05/23 [phone]     (approximate)   History   Back Pain   HPI  Mike Herrera is a 53 y.o. male past medical history significant for chronic back pain, obesity, who presents to the emergency department with lower back pain.  Patient states that he has been having pain to his lower back that radiates to his right leg.  Complaining of ongoing numbness and tingling sensation that is from his right anterior thigh down to his knee.  States that she has been trying ibuprofen  for his pain control without significant improvement.  Pain was intermittent but now it is constant.  Denies any falls or trauma.  No heavy lifting.  Denies any weakness in the leg.  No urinary or bowel incontinence.  No change in sensation with wiping.  Denies fever, chills, dysuria, urinary urgency or frequency.  States that he called for physical therapy but none is approved with his medical insurance.  History of back surgery.  On chart review patient has completed a Medrol  Dosepak with no significant improvement of his back pain.  Has been on ibuprofen  and muscle relaxer.     Physical Exam   Triage Vital Signs: ED Triage Vitals  Encounter Vitals Group     BP 08/05/23 0741 (!) 147/98     Systolic BP Percentile --      Diastolic BP Percentile --      Pulse Rate 08/05/23 0741 73     Resp 08/05/23 0741 18     Temp 08/05/23 0741 97.9 F (36.6 C)     Temp Source 08/05/23 0741 Oral     SpO2 08/05/23 0741 98 %     Weight 08/05/23 0741 273 lb (123.8 kg)     Height 08/05/23 0741 5\' 11"  (1.803 m)     Head Circumference --      Peak Flow --      Pain Score 08/05/23 0740 7     Pain Loc --      Pain Education --      Exclude from Growth Chart --     Most recent vital signs: Vitals:   08/05/23 0741  BP: (!) 147/98  Pulse: 73  Resp: 18  Temp: 97.9 F (36.6 C)  SpO2: 98%    Physical  Exam Constitutional:      Appearance: He is well-developed. He is obese.  HENT:     Head: Atraumatic.  Eyes:     Conjunctiva/sclera: Conjunctivae normal.  Cardiovascular:     Rate and Rhythm: Regular rhythm.  Pulmonary:     Effort: No respiratory distress.  Abdominal:     Tenderness: There is no abdominal tenderness.  Musculoskeletal:        General: Normal range of motion.     Cervical back: Normal range of motion.     Right lower leg: No edema.     Left lower leg: No edema.     Comments: Tender to palpation to the right lower back with no midline tenderness.  No saddle anesthesia.  +2 DP pulses that are equal.  Skin:    General: Skin is warm.     Capillary Refill: Capillary refill takes less than 2 seconds.  Neurological:     Mental Status: He is alert. Mental status is at baseline.     Comments: Tingling sensation from the right anterior thigh to the knee  but sensation is intact to bilateral lower extremities.      IMPRESSION / MDM / ASSESSMENT AND PLAN / ED COURSE  I reviewed the triage vital signs and the nursing notes.  Differential diagnosis including sciatica, piriformis syndrome, lumbar stenosis, radiculopathy, meralgia paresthetica.   Labs (all labs ordered are listed, but only abnormal results are displayed) Labs interpreted as -    Labs Reviewed - No data to display  Patient without falls or trauma, no midline back pain and no red flags to require an emergent MRI.  No findings concerning for upper motor neuron signs.  No saddle anesthesia, urinary or bowel incontinence or weakness.  Ambulatory in the emergency department.  No symptoms of urinary tract infection.  On chart review patient had a recent workup including urine and lab work that was reassuring.  Does not meet criteria for emergent MRI.  Given one-time dose of Percocet in the emergency department for pain control and Lidoderm  patches.  Discussed NSAIDs and Tylenol .  Discussed over-the-counter Lidoderm   patch.  Discussed close follow-up with primary care physician.  Discussed follow-up with spine team and likely needing repeat MRI to the lower back.  Discussed PT/OT, weight loss and not wearing tight fitting belts or clothing.  Discussed follow-up as an outpatient with pain clinic.  Discussed return precautions to the emergency department.     PROCEDURES:  Critical Care performed: No  Procedures  Patient's presentation is most consistent with severe exacerbation of chronic illness.   MEDICATIONS ORDERED IN ED: Medications  lidocaine  (LIDODERM ) 5 % 1 patch (1 patch Transdermal Patch Applied 08/05/23 0813)  oxyCODONE -acetaminophen  (PERCOCET/ROXICET) 5-325 MG per tablet 1 tablet (1 tablet Oral Given 08/05/23 7829)    FINAL CLINICAL IMPRESSION(S) / ED DIAGNOSES   Final diagnoses:  Sciatica of right side  Meralgia paresthetica of right side     Rx / DC Orders   ED Discharge Orders     None        Note:  This document was prepared using Dragon voice recognition software and may include unintentional dictation errors.   Viviano Ground, MD 08/05/23 [phone]

## 2023-08-05 NOTE — ED Notes (Signed)
 See triage note  Presents with lower back pain  States he has had back surgery    Recently went back to work  Thinks  he may have turned or twisted wrong  Ambulates well to treatment room

## 2023-08-06 MED ORDER — METHYLPREDNISOLONE 4 MG PO TBPK
ORAL_TABLET | ORAL | 0 refills | Status: DC
Start: 2023-08-06 — End: 2023-08-12

## 2023-08-06 NOTE — Telephone Encounter (Signed)
 ED notes reviewed. Recommend updated lumbar MRI scan. See message to patient.

## 2023-08-06 NOTE — Addendum Note (Signed)
 Addended byLucetta Russel on: 08/06/2023 03:17 PM   Modules accepted: Orders

## 2023-08-06 NOTE — Addendum Note (Signed)
 Addended byLucetta Russel on: 08/06/2023 08:36 AM   Modules accepted: Orders

## 2023-08-07 ENCOUNTER — Telehealth: Payer: Self-pay | Admitting: Orthopedic Surgery

## 2023-08-07 DIAGNOSIS — M47816 Spondylosis without myelopathy or radiculopathy, lumbar region: Secondary | ICD-10-CM | POA: Diagnosis not present

## 2023-08-07 DIAGNOSIS — M79604 Pain in right leg: Secondary | ICD-10-CM | POA: Diagnosis not present

## 2023-08-07 DIAGNOSIS — M48061 Spinal stenosis, lumbar region without neurogenic claudication: Secondary | ICD-10-CM | POA: Diagnosis not present

## 2023-08-07 DIAGNOSIS — M5441 Lumbago with sciatica, right side: Secondary | ICD-10-CM | POA: Diagnosis not present

## 2023-08-07 DIAGNOSIS — Z049 Encounter for examination and observation for unspecified reason: Secondary | ICD-10-CM

## 2023-08-07 NOTE — Telephone Encounter (Signed)
 He had MRI lumbar spine at [facility]. Please get these images and make him a f/u with me.   Thanks!

## 2023-08-07 NOTE — Telephone Encounter (Signed)
 This patient was seen with you on 07/22/23. He went to [facility] ER do to increased back pain. There they did an MRI. They were told to call the office to get back in ASAP. Should the patient see you or MD?   IMPRESSION: Status post right laminectomy at L2.  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  Severe spinal canal, moderate right and moderate to severe left neuroforaminal stenosis at L3-L4. 2.  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  Archibald Beard can you request the images please.

## 2023-08-08 ENCOUNTER — Inpatient Hospital Stay
Admission: RE | Admit: 2023-08-08 | Discharge: 2023-08-08 | Disposition: A | Discharge status: Home / Self Care | Payer: Self-pay | Source: Ambulatory Visit | Attending: Orthopedic Surgery | Admitting: Orthopedic Surgery

## 2023-08-08 DIAGNOSIS — Z049 Encounter for examination and observation for unspecified reason: Secondary | ICD-10-CM

## 2023-08-08 NOTE — Telephone Encounter (Signed)
 They confirmed new appt on 08/19/2023.

## 2023-08-08 NOTE — Telephone Encounter (Signed)
 He can see me for follow up

## 2023-08-09 NOTE — Progress Notes (Signed)
 REFERRING PHYSICIAN:  No referring provider defined for this encounter.  DOS: 11/20/22  Right L2-L3 microdiscectomy  HISTORY OF PRESENT ILLNESS:  He went to ED on 08/07/23 due to LBP with right leg pain. He was sent hom with prednisone  taper and ultram .   He is here for follow up.   Pain has improved with prednisone  and ultram . He finished prednisone  this morning. He was having severe LBP with right groin/anterior thigh pain to his knee. He has numbness and tingling in right leg. No  left leg pain. No weakness. Pain was worse with walking, laying down, sitting.   He has minimal LBP with only minimal right leg pain in medial/anterior thigh.   He has a few ultram  to take if needed. He is scheduled to start PT in next week or so.   He dips 1-2 cans per day x 35 years.       PHYSICAL EXAMINATION:  General: Patient is well developed, well nourished, calm, collected, and in no apparent distress.  He has no lumbar tenderness. Well healed lumbar incision.   Side Iliopsoas Quads Hamstring PF DF EHL  R 5 5 5 5 5 5   L 5 5 5 5 5 5    Reflexes are 2+ and symmetric at the patella and achilles.   No pain with IR/ER of both hips.   Bilateral lower extremity sensation is intact to light touch.     Gait is normal.    ROS (Neurologic):  Negative except as noted above  IMAGING: MRI lumbar spine dated 08/07/23:  FINDINGS: Motion degraded examination, limits evaluation. Evaluation is additionally limited by lack of high-resolution T2 space images.  Status post right laminectomy at L2.  Modic type II degenerative endplate changes at L4-L5 and L5-S1. Bone marrow signal intensity is otherwise unremarkable.  The visualized cord is unremarkable. The conus medullaris ends at a normal level. There is no abnormal enhancement.  Trace dextrocurvature of the lumbar spine. The vertebral bodies are normally aligned.  Multilevel disc desiccation and height loss. Degenerative changes of the  spine, developmental diffuse spinal canal narrowing due to short pedicles and hypertrophic epidural fat, result in varying degrees of spinal canal and neuroforaminal stenoses, as follows:  T12-L1: No significant spinal canal or neuroforaminal narrowing.  L1-L2: Disc bulge with central/right central protrusion, and ligamentum flavum thickening. Mild spinal canal stenosis. No neuroforaminal narrowing.  L2-L3: Status post right laminectomy. Asymmetric disc bulge with ligamentum flavum thickening. Mild spinal canal stenosis. Mild bilateral neuroforaminal narrowing.  L3-L4: Bulging disc with superimposed right central protrusion, ligamentum flavum thickening and epidural lipomatosis. Severe spinal canal stenosis. Moderate right and moderate to severe left neuroforaminal narrowing.  L4-L5: Asymmetric disc bulge, ligamentum flavum thickening, facet hypertrophy and posterior epidural lipomatosis. Severe spinal canal stenosis. Mild left and moderate to severe right neuroforaminal narrowing.  L5-S1: Bulging disc, with facet hypertrophy. No spinal canal stenosis. Mild bilateral neuroforaminal stenosis.  Nonspecific dependent edema in the posterior paraspinal soft tissues. Bilateral renal cysts. The paraspinal tissues are within normal limits.  For the purposes of this dictation, the lowest well formed intervertebral disc space is assumed to be the L5-S1 level, and there are presumed to be five lumbar-type vertebral bodies. Procedure Note  Rogena Class, MD - 08/07/2023  I have personally reviewed the images and agree with the above interpretation.   ASSESSMENT/PLAN:  Mike Herrera was having severe LBP with right groin/anterior thigh pain to his knee. He has numbness and tingling in right leg. No  left leg pain. No weakness. Pain was worse with walking, laying down, sitting. This improved with medications.   He currently has minimal LBP with only minimal right leg pain in medial/anterior thigh. No  left leg pain.   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 Also with moderate/severe central stenosis L4-L5 with mild left/moderate to severe right foraminal stenosis and mild bilateral foraminal stenosis L1-L3 and L5-S1.   MRI not that much different from his preop one- improvement seen at L2-L3.   Right leg pain likely from L3-L4.   Treatment options reviewed with patient and following plan made:   - He is done with prednisone . Last dose this morning. Can restart voltaren . Take with food.  - Continue prn zanaflex . Reviewed dosing and side effects. He knows this can make his sleepy.  - One time refill of ultram  for severe pain. Reviewed dosing and side effects. PMP reviewed and is appropriate.  - Start PT as scheduled this week.  - He does not want to consider any lumbar injections.  - He remains out of work.   - Follow up with me in 4 weeks. If no better, he may need to see Dr. Mont Antis to discuss further surgery options.   Advised to contact the office if any questions or concerns arise.  I spent a total of 25 minutes in face-to-face and non-face-to-face activities related to this patient's care today including review of outside records, review of imaging, review of symptoms, physical exam, discussion of differential diagnosis, discussion of treatment options, and documentation.   Lucetta Russel PA-C Department of neurosurgery

## 2023-08-12 ENCOUNTER — Ambulatory Visit (INDEPENDENT_AMBULATORY_CARE_PROVIDER_SITE_OTHER): Admitting: Orthopedic Surgery

## 2023-08-12 ENCOUNTER — Encounter: Payer: Self-pay | Admitting: Orthopedic Surgery

## 2023-08-12 VITALS — BP 114/80 | Ht 71.0 in | Wt 273.0 lb

## 2023-08-12 DIAGNOSIS — M48061 Spinal stenosis, lumbar region without neurogenic claudication: Secondary | ICD-10-CM

## 2023-08-12 DIAGNOSIS — M47816 Spondylosis without myelopathy or radiculopathy, lumbar region: Secondary | ICD-10-CM

## 2023-08-12 DIAGNOSIS — Z9889 Other specified postprocedural states: Secondary | ICD-10-CM

## 2023-08-12 DIAGNOSIS — M5416 Radiculopathy, lumbar region: Secondary | ICD-10-CM

## 2023-08-12 MED ORDER — TRAMADOL HCL 50 MG PO TABS: 50.0000 mg | ORAL_TABLET | Freq: Three times a day (TID) | ORAL | 0 refills | Status: AC | PRN

## 2023-08-12 NOTE — Patient Instructions (Signed)
 It was so nice to see you today. Thank you so much for coming in.    I'm glad you are feeling better!  Start PT as scheduled.   I sent a refill of tramadol  to your pharmacy to take for severe pain only. This can make you sleepy and/or constipated. Take only as needed.   You can restart diclofenac  as needed- this is for pain and inflammation.   I will see you back in 4 weeks. Please do not hesitate to call if you have any questions or concerns. You can also message me in MyChart.   Lucetta Russel PA-C [phone]     The physicians and staff at [facility] Neurosurgery at [facility] are committed to providing excellent care. You may receive a survey asking for feedback about your experience at our office. We value you your feedback and appreciate you taking the time to to fill it out. The [facility] leadership team is also available to discuss your experience in person, feel free to contact us  [phone].

## 2023-08-19 ENCOUNTER — Ambulatory Visit: Admitting: Orthopedic Surgery

## 2023-08-30 NOTE — Progress Notes (Signed)
   REFERRING PHYSICIAN:  Center, Charles Bard [facility] [address] [place],  [place] [address]  DOS: 11/20/22  Right L2-L3 microdiscectomy  HISTORY OF PRESENT ILLNESS:  Last seen by me on 08/12/23. He was doing better.   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 Also with moderate/severe central stenosis L4-L5 with mild left/moderate to severe right foraminal stenosis and mild bilateral foraminal stenosis L1-L3 and L5-S1.   MRI not that much different from his preop one- improvement seen at L2-L3.   He was to start PT for his back. He remains out of work. He was given zanaflex  and one time script of ultram .   He is here for follow up.   He is doing better! No LBP for last month. He has some medial right thigh numbness, but no groin pain. History of chronic right knee pain x years. He has giving way and some locking/catching of right knee.   He has not started PT.   He dips 1-2 cans per day x 35 years.    PHYSICAL EXAMINATION:  General: Patient is well developed, well nourished, calm, collected, and in no apparent distress.  Well healed lumbar incision.   Side Iliopsoas Quads Hamstring PF DF EHL  R 5 5 5 5 5 5   L 5 5 5 5 5 5    No pain with IR/ER of both hips.   Bilateral lower extremity sensation is intact to light touch.     Mild joint line tenderness right knee.   Gait is normal.    ROS (Neurologic):  Negative except as noted above  IMAGING: none  ASSESSMENT/PLAN:  Mike Herrera is doing better! No LBP for last month. He has some medial right thigh numbness, but no groin pain.   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 Also with moderate/severe central stenosis L4-L5 with mild left/moderate to severe right foraminal stenosis and  mild bilateral foraminal stenosis L1-L3 and L5-S1.   History of chronic right knee pain x years. He has giving way and some locking/catching of right knee.  This appears to be knee mediated.   Treatment options reviewed with patient and following plan made:   - Continue prn zanaflex . Reviewed dosing and side effects. He knows this can make his sleepy. Refill given.  - Continue prn voltaren . Take with food.  - Referral to ortho for right knee.  - Note to return to work with restrictions.  - Follow up with me in 2 months and prn.   Advised to contact the office if any questions or concerns arise.  I spent a total of 20 minutes in face-to-face and non-face-to-face activities related to this patient's care today including review of outside records, review of imaging, review of symptoms, physical exam, discussion of differential diagnosis, discussion of treatment options, and documentation.   Glade Boys PA-C Department of neurosurgery

## 2023-09-11 ENCOUNTER — Ambulatory Visit: Admitting: Orthopedic Surgery

## 2023-09-12 ENCOUNTER — Ambulatory Visit: Admitting: Orthopedic Surgery

## 2023-09-12 ENCOUNTER — Encounter: Payer: Self-pay | Admitting: Orthopedic Surgery

## 2023-09-12 ENCOUNTER — Telehealth: Payer: Self-pay

## 2023-09-12 VITALS — BP 124/84 | Ht 71.0 in | Wt 273.0 lb

## 2023-09-12 DIAGNOSIS — M4726 Other spondylosis with radiculopathy, lumbar region: Secondary | ICD-10-CM | POA: Diagnosis not present

## 2023-09-12 DIAGNOSIS — M48061 Spinal stenosis, lumbar region without neurogenic claudication: Secondary | ICD-10-CM

## 2023-09-12 DIAGNOSIS — M25561 Pain in right knee: Secondary | ICD-10-CM | POA: Diagnosis not present

## 2023-09-12 DIAGNOSIS — Z9889 Other specified postprocedural states: Secondary | ICD-10-CM | POA: Diagnosis not present

## 2023-09-12 DIAGNOSIS — M47816 Spondylosis without myelopathy or radiculopathy, lumbar region: Secondary | ICD-10-CM

## 2023-09-12 DIAGNOSIS — M5416 Radiculopathy, lumbar region: Secondary | ICD-10-CM

## 2023-09-12 MED ORDER — TIZANIDINE HCL 2 MG PO CAPS
2.0000 mg | ORAL_CAPSULE | Freq: Three times a day (TID) | ORAL | 0 refills | Status: DC | PRN
Start: 2023-09-12 — End: 2023-09-16

## 2023-09-12 NOTE — Telephone Encounter (Signed)
 Authorization for Tizanidine  has been submitted through Cover My Meds. This is currently pending, clinicals have been uploaded as well.  PA Case ID #: [id number]  Key# [id number]

## 2023-09-12 NOTE — Patient Instructions (Signed)
 It was so nice to see you today. Thank you so much for coming in.    Continue on diclofenac   to help with pain and inflammation. Take as directed with food.   I also sent a prescription for tizanidine  to help with muscle spasms. Use only as needed and be careful, this can make you sleepy.  I want you to see ortho at the [facility] for evaluation of right knee pain. They should call you to schedule an appointment or you can call them at number below.  I will see you back in 2 months. Please do not hesitate to call if you have any questions or concerns. You can also message me in MyChart.   Glade Boys PA-C [phone]     The physicians and staff at [facility] Neurosurgery at [facility] are committed to providing excellent care. You may receive a survey asking for feedback about your experience at our office. We value you your feedback and appreciate you taking the time to to fill it out. The [facility] leadership team is also available to discuss your experience in person, feel free to contact us  [phone].

## 2023-09-12 NOTE — Telephone Encounter (Signed)
Auth is still pending review.

## 2023-09-16 MED ORDER — TIZANIDINE HCL 2 MG PO TABS
2.0000 mg | ORAL_TABLET | Freq: Three times a day (TID) | ORAL | 0 refills | Status: DC | PRN
Start: 2023-09-16 — End: 2023-10-16

## 2023-09-16 NOTE — Telephone Encounter (Signed)
 Hey, this request was denied. It says the reason is medical necessity and that the patient must have muscle spasms. There is an option to do an appeal. However, looking at his dispense history, it appears the previous prescriptions were for tablets and this most recent request was for capsules. I think this may be the issue. Before we do an appeal, it may be worth resending the rx for tablets and then one of us  can call the pharmacy to make sure it goes through and insurance pays. If not, then we can submit an appeal.

## 2023-09-16 NOTE — Telephone Encounter (Signed)
 Called [facility] and spoke with Powell, tablets went through with insurance and they are going to get them ready for pick up. Capsules RX was cancelled.

## 2023-09-16 NOTE — Telephone Encounter (Signed)
 I sent zanaflex  tablets to pharmacy. Please call and cancel the capsules.   Let's see if they will get approved.   Thanks.

## 2023-09-27 ENCOUNTER — Telehealth: Payer: Self-pay

## 2023-09-27 DIAGNOSIS — G8929 Other chronic pain: Secondary | ICD-10-CM

## 2023-09-27 NOTE — Telephone Encounter (Signed)
 Spoke to patient and he states that he will go have xrays done on Monday  orders are in and address was provided

## 2023-09-30 ENCOUNTER — Ambulatory Visit: Admitting: Orthopedic Surgery

## 2023-10-16 DIAGNOSIS — M5416 Radiculopathy, lumbar region: Secondary | ICD-10-CM

## 2023-10-16 DIAGNOSIS — Z9889 Other specified postprocedural states: Secondary | ICD-10-CM

## 2023-10-16 DIAGNOSIS — M47816 Spondylosis without myelopathy or radiculopathy, lumbar region: Secondary | ICD-10-CM

## 2023-10-16 MED ORDER — TIZANIDINE HCL 2 MG PO TABS
2.0000 mg | ORAL_TABLET | Freq: Three times a day (TID) | ORAL | 0 refills | Status: DC | PRN
Start: 2023-10-16 — End: 2023-11-18

## 2023-11-06 ENCOUNTER — Telehealth: Payer: Self-pay | Admitting: Orthopedic Surgery

## 2023-11-06 DIAGNOSIS — M545 Low back pain, unspecified: Secondary | ICD-10-CM

## 2023-11-06 DIAGNOSIS — M47816 Spondylosis without myelopathy or radiculopathy, lumbar region: Secondary | ICD-10-CM

## 2023-11-06 DIAGNOSIS — M5416 Radiculopathy, lumbar region: Secondary | ICD-10-CM

## 2023-11-06 DIAGNOSIS — Z9889 Other specified postprocedural states: Secondary | ICD-10-CM

## 2023-11-06 NOTE — Telephone Encounter (Signed)
 Patient notified and expressed understanding.

## 2023-11-06 NOTE — Telephone Encounter (Signed)
 Diclofenac  refill sent to pharmacy. Please let him know.   He has f/u scheduled with me on 9/8.

## 2023-11-15 NOTE — Progress Notes (Signed)
   REFERRING PHYSICIAN:  Center, Charles Bard [facility] [address] [place],  [place] [address]  DOS: 11/20/22  Right L2-L3 microdiscectomy  HISTORY OF PRESENT ILLNESS:  Last seen by me on 09/12/23. He was doing better with no pain in last month.   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 Also with moderate/severe central stenosis L4-L5 with mild left/moderate to severe right foraminal stenosis and mild bilateral foraminal stenosis L1-L3 and L5-S1.   MRI not that much different from his preop one- improvement seen at L2-L3.   He was to continue on prn voltaren  and zanaflex . He was referred to ortho for his right knee (no show to appt on 09/30/23) and given restrictions for work.   He is here for follow up.   Overall, his back pain continues to improve. He has some numbness in his right knee. Has not seen ortho as above.   He continues on diclofenac  and tizanidine .   He dips 1-2 cans per day x 35 years.    PHYSICAL EXAMINATION:  General: Patient is well developed, well nourished, calm, collected, and in no apparent distress.  Well healed lumbar incision.   Side Iliopsoas Quads Hamstring PF DF EHL  R 5 5 5 5 5 5   L 5 5 5 5 5 5    No pain with IR/ER of both hips.   Bilateral lower extremity sensation is intact to light touch.     Gait is normal.    ROS (Neurologic):  Negative except as noted above  IMAGING: none  ASSESSMENT/PLAN:  Mike Herrera is doing better! He has no significant LBP. Some intermittent tingling/numbness in right knee.   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 Also with moderate/severe central stenosis L4-L5 with mild left/moderate to severe right foraminal stenosis and mild bilateral foraminal stenosis L1-L3 and L5-S1.    Treatment options reviewed with patient and following plan made:   - Continue prn zanaflex . Reviewed dosing and side effects. He knows this can make his sleepy. Refill given.  - Continue prn voltaren . Take with food.  - He does not want to see ortho for the right knee.  - Follow up with me in 3-4 months and prn.   BP was slightly elevated. No symptoms of chest pain, shortness of breath, blurry vision, or headaches. He declines recheck of BP at end of visit.    Advised to contact the office if any questions or concerns arise.  I spent a total of 15 minutes in face-to-face and non-face-to-face activities related to this patient's care today including review of outside records, review of imaging, review of symptoms, physical exam, discussion of differential diagnosis, discussion of treatment options, and documentation.   Glade Boys PA-C Department of neurosurgery

## 2023-11-18 ENCOUNTER — Encounter: Payer: Self-pay | Admitting: Orthopedic Surgery

## 2023-11-18 ENCOUNTER — Ambulatory Visit (INDEPENDENT_AMBULATORY_CARE_PROVIDER_SITE_OTHER): Admitting: Orthopedic Surgery

## 2023-11-18 DIAGNOSIS — R030 Elevated blood-pressure reading, without diagnosis of hypertension: Secondary | ICD-10-CM | POA: Diagnosis not present

## 2023-11-18 DIAGNOSIS — M47816 Spondylosis without myelopathy or radiculopathy, lumbar region: Secondary | ICD-10-CM

## 2023-11-18 DIAGNOSIS — M48061 Spinal stenosis, lumbar region without neurogenic claudication: Secondary | ICD-10-CM | POA: Diagnosis not present

## 2023-11-18 DIAGNOSIS — Z9889 Other specified postprocedural states: Secondary | ICD-10-CM | POA: Diagnosis not present

## 2023-11-18 DIAGNOSIS — M5416 Radiculopathy, lumbar region: Secondary | ICD-10-CM

## 2023-11-18 MED ORDER — TIZANIDINE HCL 2 MG PO TABS
2.0000 mg | ORAL_TABLET | Freq: Three times a day (TID) | ORAL | 0 refills | Status: DC | PRN
Start: 2023-11-18 — End: 2023-12-16

## 2023-12-16 ENCOUNTER — Telehealth: Payer: Self-pay | Admitting: Orthopedic Surgery

## 2023-12-16 DIAGNOSIS — Z9889 Other specified postprocedural states: Secondary | ICD-10-CM

## 2023-12-16 DIAGNOSIS — M545 Low back pain, unspecified: Secondary | ICD-10-CM

## 2023-12-16 DIAGNOSIS — M5416 Radiculopathy, lumbar region: Secondary | ICD-10-CM

## 2023-12-16 DIAGNOSIS — M47816 Spondylosis without myelopathy or radiculopathy, lumbar region: Secondary | ICD-10-CM

## 2023-12-16 MED ORDER — DICLOFENAC SODIUM 75 MG PO TBEC: 75.0000 mg | DELAYED_RELEASE_TABLET | Freq: Two times a day (BID) | ORAL | 1 refills | Status: DC

## 2023-12-16 MED ORDER — TIZANIDINE HCL 2 MG PO TABS: 2.0000 mg | ORAL_TABLET | Freq: Three times a day (TID) | ORAL | 1 refills | Status: DC | PRN

## 2023-12-16 NOTE — Telephone Encounter (Signed)
 Patient is calling to request a refill of Zanaflex  and Diclofenac  be sent to [facility] on [address].

## 2023-12-16 NOTE — Telephone Encounter (Signed)
 Refill of zanaflex  and diclofenac . Sent to pharmacy. Will put one refill on each.   Please let him know.   He has f/u with me on 03/23/24.

## 2024-01-13 ENCOUNTER — Encounter: Payer: Self-pay | Admitting: Radiology

## 2024-02-17 ENCOUNTER — Telehealth: Payer: Self-pay | Admitting: Orthopedic Surgery

## 2024-02-17 DIAGNOSIS — M5416 Radiculopathy, lumbar region: Secondary | ICD-10-CM

## 2024-02-17 DIAGNOSIS — Z9889 Other specified postprocedural states: Secondary | ICD-10-CM

## 2024-02-17 DIAGNOSIS — M545 Low back pain, unspecified: Secondary | ICD-10-CM

## 2024-02-17 DIAGNOSIS — M47816 Spondylosis without myelopathy or radiculopathy, lumbar region: Secondary | ICD-10-CM

## 2024-02-17 NOTE — Telephone Encounter (Signed)
 Zanaflex  and voltaren  refills sent to pharmacy. He has f/u scheduled with me on 03/24/23.   Please let him know.

## 2024-02-17 NOTE — Telephone Encounter (Signed)
 Patient notified and voiced understanding.

## 2024-02-17 NOTE — Telephone Encounter (Signed)
 Forwarded refill requests to [facility]

## 2024-03-17 ENCOUNTER — Ambulatory Visit: Admitting: Orthopedic Surgery

## 2024-03-17 ENCOUNTER — Encounter: Payer: Self-pay | Admitting: Orthopedic Surgery

## 2024-03-17 DIAGNOSIS — M48061 Spinal stenosis, lumbar region without neurogenic claudication: Secondary | ICD-10-CM | POA: Diagnosis not present

## 2024-03-17 DIAGNOSIS — M47816 Spondylosis without myelopathy or radiculopathy, lumbar region: Secondary | ICD-10-CM

## 2024-03-17 DIAGNOSIS — M545 Low back pain, unspecified: Secondary | ICD-10-CM

## 2024-03-17 DIAGNOSIS — Z9889 Other specified postprocedural states: Secondary | ICD-10-CM

## 2024-03-17 DIAGNOSIS — M5416 Radiculopathy, lumbar region: Secondary | ICD-10-CM

## 2024-03-17 MED ORDER — METHYLPREDNISOLONE 4 MG PO TBPK: ORAL_TABLET | ORAL | 0 refills | Status: DC

## 2024-03-17 MED ORDER — DICLOFENAC SODIUM 75 MG PO TBEC: 75.0000 mg | DELAYED_RELEASE_TABLET | Freq: Two times a day (BID) | ORAL | 0 refills | Status: DC

## 2024-03-17 MED ORDER — TIZANIDINE HCL 2 MG PO TABS: 2.0000 mg | ORAL_TABLET | Freq: Three times a day (TID) | ORAL | 0 refills | Status: DC | PRN

## 2024-03-17 NOTE — Telephone Encounter (Signed)
 Spoke with patient. Started to have left lower back pain about 4 to 5 days ago after playing with his kids (he was sitting on his bed), opened the window and then sneezed 4 to 5 times and started to feel pain. Pain does not radiate down his buttocks or legs. No numbness or weakness in his legs. No bowel or bladder incontinence issues. Pain starts when he stands up and start getting things done around the house, some days when he wakes up in the morning there is no pain with standing up and some days there is. Some days its more severe than others with standing up. No pain with laying, bending or sitting. Describes it at a sharp pain usually.  Medications he has tried: Tylenol  500 mg 3 tablets a day and Ibuprofen  200 mg 4 a day-alternates Tylenol  and Ibuprofen  Has left over Tizanidine  3 mg-has taking it at 1 tablet at bedtime mainly but also tried 1 tablet during the day some of the days. No relief with current medication management. Last office visit was on 11/18/2023 and Next appointment is on 03/23/2024

## 2024-03-17 NOTE — Telephone Encounter (Signed)
 Had cancellation. He will see me today at 2:30. I called and let him know.

## 2024-03-17 NOTE — Progress Notes (Signed)
" ° °  REFERRING PHYSICIAN:  Center, Charles Bard [facility] [address] 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 Also with moderate/severe central stenosis L4-L5 with mild left/moderate to severe right foraminal stenosis and mild bilateral foraminal stenosis L1-L3 and L5-S1.   MRI not that much different from his preop one- improvement seen at L2-L3.   He was doing well until 5 days ago. He sneezed twice, was opening window, and playing with kids. He noted increased LBP.   He has intermittent left sided LBP that is slowly improving. No leg pain. Pain is worse when he gets up from sitting. No pain with sitting or walking. No numbness, tingling, or weakness.   He continues on diclofenac  and tizanidine .   He dips 1-2 cans per day x 35 years.    No bowel/bladder incontinence.    PHYSICAL EXAMINATION:  General: Patient is well developed, well nourished, calm, collected, and in no apparent distress.  Well healed lumbar incision. Mild left sided lower lumbar tenderness. No tenderness on right side.   Side Iliopsoas Quads Hamstring PF DF EHL  R 5 5 5 5 5 5   L 5 5 5 5 5 5    No pain with IR/ER of both hips.   Bilateral lower extremity sensation is intact to light touch.     Gait is normal.    ROS (Neurologic):  Negative except as noted above  IMAGING: none  ASSESSMENT/PLAN:  Mike Herrera has 5 day history of intermittent left sided LBP that is slowly improving. No leg pain. Pain is worse when he gets up from sitting. No pain with sitting or walking. No numbness, tingling, or weakness.   Pain is improving.   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 Also with moderate/severe central stenosis L4-L5 with mild left/moderate to severe right foraminal stenosis and mild bilateral foraminal stenosis L1-L3 and L5-S1.   Treatment options reviewed with patient and following plan made:   - Continue prn zanaflex . Reviewed dosing and side effects. He knows this can make his sleepy. Refill given.  - Medrol  dose pack to help with current symptoms. Reviewed dosing and side effects.  - Continue prn voltaren , but will hold while on dose pack. Take with food. Refill given.  - Follow up with me in 6-8 weeks and prn.   Advised to contact the office if any questions or concerns arise.  I spent a total of 20 minutes in face-to-face and non-face-to-face activities related to this patient's care today including review of outside records, review of imaging, review of symptoms, physical exam, discussion of differential diagnosis, discussion of treatment options, and documentation.   Glade Boys PA-C Department of neurosurgery "

## 2024-03-23 ENCOUNTER — Ambulatory Visit: Admitting: Orthopedic Surgery

## 2024-04-08 DIAGNOSIS — Z9889 Other specified postprocedural states: Secondary | ICD-10-CM

## 2024-04-08 DIAGNOSIS — M48061 Spinal stenosis, lumbar region without neurogenic claudication: Secondary | ICD-10-CM

## 2024-04-08 DIAGNOSIS — M5416 Radiculopathy, lumbar region: Secondary | ICD-10-CM

## 2024-04-08 DIAGNOSIS — M25561 Pain in right knee: Secondary | ICD-10-CM

## 2024-04-08 DIAGNOSIS — M47816 Spondylosis without myelopathy or radiculopathy, lumbar region: Secondary | ICD-10-CM

## 2024-04-08 MED ORDER — TRAMADOL HCL 50 MG PO TABS: 50.0000 mg | ORAL_TABLET | Freq: Three times a day (TID) | ORAL | 0 refills | Status: AC | PRN

## 2024-04-08 NOTE — Telephone Encounter (Signed)
 Patient sent MyChart message.

## 2024-04-08 NOTE — Addendum Note (Signed)
 Addended byBETHA HILMA HASTINGS on: 04/08/2024 04:10 PM   Modules accepted: Orders

## 2024-04-08 NOTE — Telephone Encounter (Signed)
 Limited ultram  script sent to pharmacy. PMP reviewed and is appropriate. Referral to ortho done.

## 2024-04-08 NOTE — Telephone Encounter (Signed)
 Spoke with patient. Woke up about 5 days ago with more intense pain again in the right lower back-constant, and Right knee-constant. He is having Slight pain and some numbness from Right thigh to the Right knee off and on. No weakness, no bladder or bowel incontinence, no perianal numbness. No recent falls or injuries. Pain is worse with movement-for example trying to stand up from sitting. He has been taking Ibuprofen  200 mg 2 tablets once daily, Tylenol  500 mg 1 tablet once daily, Voltaren  75 mg 1 tablet twice daily, Tizanidine  2 mg 1 tablet every 8 hours. His pain is not better.  He finished Medrolpak that was given on 03/17/24

## 2024-04-10 ENCOUNTER — Ambulatory Visit (INDEPENDENT_AMBULATORY_CARE_PROVIDER_SITE_OTHER)

## 2024-04-10 ENCOUNTER — Ambulatory Visit

## 2024-04-10 VITALS — BP 124/83 | HR 89 | Ht 70.0 in | Wt 276.4 lb

## 2024-04-10 DIAGNOSIS — G8929 Other chronic pain: Secondary | ICD-10-CM

## 2024-04-10 DIAGNOSIS — M25561 Pain in right knee: Secondary | ICD-10-CM

## 2024-04-10 DIAGNOSIS — M222X1 Patellofemoral disorders, right knee: Secondary | ICD-10-CM

## 2024-04-10 NOTE — Progress Notes (Signed)
" ° °  Office Visit Note   Patient: Mike Herrera           Date of Birth: [birth year]           MRN: [id number] Visit Date: 04/10/2024              Requested by: Hilma Hastings, PA-C [address] [place],  [place] [address] PCP: [facility]   Assessment & Plan: Visit Diagnoses:  1. Patellofemoral disorder of right knee   2. Chronic pain of right knee     Plan: Natural history and expected course discussed. Questions answered. Recommend rest and ice. PT referral for knee VMO strengthening. Recommend acetaminophen  as needed for pain. Patellar stabilization brace. Follow up prn  Orders:  Orders Placed This Encounter  Procedures   DG Knee 3 Views Right   Ambulatory referral to Physical Therapy     Subjective: Chief Complaint: Right knee pain  HPI Patient is a 54 y.o. year old male who presents with knee pain involving the  right knee. Onset of the symptoms was 2 years ago. Inciting event: none known. Current symptoms include pain in the front of the knee. Pain is aggravated by going up and down stairs and rising after sitting.  Patient has had no prior history of knee problems.. Treatment to date: none.  Objective: Vital Signs: BP 124/83   Pulse 89   Ht 5' 10 (1.778 m)   Wt 125.4 kg   BMI 39.66 kg/m   Physical Exam Gen: Alert, No Acute Distress right knee: Skin intact, no erythema or induration noted. lateral patellar facet tenderness to palpation. Range of motion 0 to 135. No instability with varus or valgus stress. + patellar grind  Imaging: Radiographs personally reviewed by me; reveal mild lateral patellar tilt of the right knee   PMFS History: Patient Active Problem List   Diagnosis Date Noted   HNP (herniated nucleus pulposus), lumbar 11/19/2022   Lumbar radiculopathy 11/19/2022   Weakness of right leg 11/19/2022   Past Medical History:  Diagnosis Date   Medical history non-contributory     No family history  on file.  Past Surgical History:  Procedure Laterality Date   LUMBAR LAMINECTOMY/DECOMPRESSION MICRODISCECTOMY Right 11/20/2022   Procedure: RIGHT L2-3 MICRODISCECTOMY;  Surgeon: Claudene Penne ORN, MD;  Location: [facility] ORS;  Service: Neurosurgery;  Laterality: Right;   Social History   Occupational History   Not on file  Tobacco Use   Smoking status: Former   Smokeless tobacco: Current    Types: Chew   Tobacco comments:    Chewing tobacco , sometimes vaping  Substance and Sexual Activity   Alcohol use: Yes    Alcohol/week: 3.0 standard drinks of alcohol    Types: 3 Cans of beer per week    Comment: occ   Drug use: No   Sexual activity: Not on file   Current Outpatient Medications  Medication Instructions   acetaminophen  (TYLENOL ) 500 mg, Every 6 hours PRN   diclofenac  (VOLTAREN ) 75 mg, Oral, 2 times daily after meals   tiZANidine  (ZANAFLEX ) 2 mg, Oral, Every 8 hours PRN   traMADol  (ULTRAM ) 50 mg, Oral, Every 8 hours PRN   Allergies as of 04/10/2024   (No Known Allergies)   "

## 2024-04-11 ENCOUNTER — Other Ambulatory Visit: Payer: Self-pay | Admitting: Orthopedic Surgery

## 2024-04-11 DIAGNOSIS — M545 Low back pain, unspecified: Secondary | ICD-10-CM

## 2024-04-11 DIAGNOSIS — M5416 Radiculopathy, lumbar region: Secondary | ICD-10-CM

## 2024-04-11 DIAGNOSIS — Z9889 Other specified postprocedural states: Secondary | ICD-10-CM

## 2024-04-11 DIAGNOSIS — M47816 Spondylosis without myelopathy or radiculopathy, lumbar region: Secondary | ICD-10-CM

## 2024-04-15 ENCOUNTER — Other Ambulatory Visit: Payer: Self-pay

## 2024-04-15 DIAGNOSIS — M222X1 Patellofemoral disorders, right knee: Secondary | ICD-10-CM

## 2024-04-15 DIAGNOSIS — G8929 Other chronic pain: Secondary | ICD-10-CM

## 2024-04-17 MED ORDER — TIZANIDINE HCL 2 MG PO TABS: 2.0000 mg | ORAL_TABLET | Freq: Three times a day (TID) | ORAL | 0 refills | Status: AC | PRN

## 2024-04-17 NOTE — Addendum Note (Signed)
 Addended by: GREGORY EDSEL CROME on: 04/17/2024 02:42 PM   Modules accepted: Orders

## 2024-04-17 NOTE — Telephone Encounter (Signed)
 Zanaflex  2mg  Q8H

## 2024-04-21 ENCOUNTER — Encounter

## 2024-05-12 ENCOUNTER — Ambulatory Visit: Admitting: Orthopedic Surgery
